# Patient Record
Sex: Female | Born: 1945 | Race: White | Hispanic: No | Marital: Married | Smoking: Never smoker
Health system: Southern US, Community
[De-identification: ages and names within clinical notes are randomized; demographics above are authoritative.]

## PROBLEM LIST (undated history)

## (undated) DIAGNOSIS — I341 Nonrheumatic mitral (valve) prolapse: Secondary | ICD-10-CM

## (undated) DIAGNOSIS — K5790 Diverticulosis of intestine, part unspecified, without perforation or abscess without bleeding: Secondary | ICD-10-CM

## (undated) DIAGNOSIS — C50919 Malignant neoplasm of unspecified site of unspecified female breast: Secondary | ICD-10-CM

## (undated) DIAGNOSIS — J189 Pneumonia, unspecified organism: Secondary | ICD-10-CM

## (undated) DIAGNOSIS — I4891 Unspecified atrial fibrillation: Secondary | ICD-10-CM

## (undated) DIAGNOSIS — K222 Esophageal obstruction: Secondary | ICD-10-CM

## (undated) DIAGNOSIS — I499 Cardiac arrhythmia, unspecified: Secondary | ICD-10-CM

## (undated) DIAGNOSIS — N183 Chronic kidney disease, stage 3 unspecified: Secondary | ICD-10-CM

## (undated) DIAGNOSIS — M1990 Unspecified osteoarthritis, unspecified site: Secondary | ICD-10-CM

## (undated) DIAGNOSIS — I10 Essential (primary) hypertension: Secondary | ICD-10-CM

## (undated) DIAGNOSIS — K219 Gastro-esophageal reflux disease without esophagitis: Secondary | ICD-10-CM

## (undated) DIAGNOSIS — N289 Disorder of kidney and ureter, unspecified: Secondary | ICD-10-CM

## (undated) DIAGNOSIS — R011 Cardiac murmur, unspecified: Secondary | ICD-10-CM

## (undated) DIAGNOSIS — E785 Hyperlipidemia, unspecified: Secondary | ICD-10-CM

## (undated) DIAGNOSIS — I011 Acute rheumatic endocarditis: Secondary | ICD-10-CM

## (undated) DIAGNOSIS — K649 Unspecified hemorrhoids: Secondary | ICD-10-CM

## (undated) DIAGNOSIS — M797 Fibromyalgia: Secondary | ICD-10-CM

## (undated) HISTORY — PX: CATARACT EXTRACTION: SUR2

## (undated) HISTORY — DX: Fibromyalgia: M79.7

## (undated) HISTORY — DX: Malignant neoplasm of unspecified site of unspecified female breast: C50.919

## (undated) HISTORY — PX: CARDIAC CATHETERIZATION: SHX172

## (undated) HISTORY — DX: Unspecified osteoarthritis, unspecified site: M19.90

## (undated) HISTORY — PX: BREAST LUMPECTOMY: SHX2

## (undated) HISTORY — DX: Unspecified hemorrhoids: K64.9

## (undated) HISTORY — DX: Unspecified atrial fibrillation: I48.91

## (undated) HISTORY — DX: Acute rheumatic endocarditis: I01.1

## (undated) HISTORY — PX: DILATION AND CURETTAGE OF UTERUS: SHX78

## (undated) HISTORY — PX: ESOPHAGEAL DILATION: SHX303

## (undated) HISTORY — DX: Diverticulosis of intestine, part unspecified, without perforation or abscess without bleeding: K57.90

## (undated) HISTORY — DX: Chronic kidney disease, stage 3 unspecified: N18.30

## (undated) HISTORY — DX: Essential (primary) hypertension: I10

## (undated) HISTORY — DX: Disorder of kidney and ureter, unspecified: N28.9

## (undated) HISTORY — DX: Hyperlipidemia, unspecified: E78.5

---
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
## 1966-09-21 HISTORY — PX: TONSILLECTOMY: SUR1361

## 1970-09-21 HISTORY — PX: BREAST BIOPSY: SHX20

## 1984-09-21 HISTORY — PX: MASTECTOMY: SHX3

## 1993-09-21 HISTORY — PX: ABDOMINAL HYSTERECTOMY: SHX81

## 1997-09-21 HISTORY — PX: CHOLECYSTECTOMY: SHX55

## 1997-12-20 ENCOUNTER — Encounter: Admission: RE | Admit: 1997-12-20 | Discharge: 1998-03-20 | Payer: Self-pay | Admitting: Internal Medicine

## 1998-01-29 ENCOUNTER — Ambulatory Visit (HOSPITAL_BASED_OUTPATIENT_CLINIC_OR_DEPARTMENT_OTHER): Admission: RE | Admit: 1998-01-29 | Discharge: 1998-01-29 | Payer: Self-pay | Admitting: General Surgery

## 1998-04-18 ENCOUNTER — Ambulatory Visit (HOSPITAL_COMMUNITY): Admission: RE | Admit: 1998-04-18 | Discharge: 1998-04-18 | Payer: Self-pay | Admitting: Family Medicine

## 1998-04-25 ENCOUNTER — Observation Stay (HOSPITAL_COMMUNITY): Admission: RE | Admit: 1998-04-25 | Discharge: 1998-04-26 | Payer: Self-pay | Admitting: General Surgery

## 1998-06-05 ENCOUNTER — Ambulatory Visit (HOSPITAL_COMMUNITY): Admission: RE | Admit: 1998-06-05 | Discharge: 1998-06-05 | Payer: Self-pay | Admitting: General Surgery

## 1998-08-23 ENCOUNTER — Encounter: Payer: Self-pay | Admitting: General Surgery

## 1998-08-23 ENCOUNTER — Ambulatory Visit (HOSPITAL_COMMUNITY): Admission: RE | Admit: 1998-08-23 | Discharge: 1998-08-23 | Payer: Self-pay | Admitting: General Surgery

## 1998-08-30 ENCOUNTER — Ambulatory Visit (HOSPITAL_COMMUNITY): Admission: RE | Admit: 1998-08-30 | Discharge: 1998-08-30 | Payer: Self-pay | Admitting: General Surgery

## 1998-08-30 ENCOUNTER — Encounter: Payer: Self-pay | Admitting: General Surgery

## 1998-09-17 ENCOUNTER — Ambulatory Visit (HOSPITAL_COMMUNITY): Admission: RE | Admit: 1998-09-17 | Discharge: 1998-09-17 | Payer: Self-pay | Admitting: General Surgery

## 1998-09-17 ENCOUNTER — Encounter: Payer: Self-pay | Admitting: General Surgery

## 1998-09-21 HISTORY — PX: THYROID CYST EXCISION: SHX2511

## 1999-01-22 ENCOUNTER — Other Ambulatory Visit: Admission: RE | Admit: 1999-01-22 | Discharge: 1999-01-22 | Payer: Self-pay | Admitting: *Deleted

## 2000-02-06 ENCOUNTER — Encounter: Payer: Self-pay | Admitting: Gastroenterology

## 2000-02-06 ENCOUNTER — Encounter: Admission: RE | Admit: 2000-02-06 | Discharge: 2000-02-06 | Payer: Self-pay | Admitting: Gastroenterology

## 2001-01-17 ENCOUNTER — Encounter: Payer: Self-pay | Admitting: General Surgery

## 2001-01-17 ENCOUNTER — Encounter: Admission: RE | Admit: 2001-01-17 | Discharge: 2001-01-17 | Payer: Self-pay | Admitting: General Surgery

## 2001-01-26 ENCOUNTER — Other Ambulatory Visit: Admission: RE | Admit: 2001-01-26 | Discharge: 2001-01-26 | Payer: Self-pay | Admitting: *Deleted

## 2001-02-17 ENCOUNTER — Inpatient Hospital Stay (HOSPITAL_COMMUNITY): Admission: EM | Admit: 2001-02-17 | Discharge: 2001-02-18 | Payer: Self-pay | Admitting: Emergency Medicine

## 2002-01-31 ENCOUNTER — Encounter (HOSPITAL_BASED_OUTPATIENT_CLINIC_OR_DEPARTMENT_OTHER): Payer: Self-pay | Admitting: Internal Medicine

## 2002-01-31 ENCOUNTER — Encounter: Admission: RE | Admit: 2002-01-31 | Discharge: 2002-01-31 | Payer: Self-pay | Admitting: Internal Medicine

## 2002-09-29 ENCOUNTER — Encounter (HOSPITAL_BASED_OUTPATIENT_CLINIC_OR_DEPARTMENT_OTHER): Payer: Self-pay | Admitting: Internal Medicine

## 2002-09-29 ENCOUNTER — Encounter: Admission: RE | Admit: 2002-09-29 | Discharge: 2002-09-29 | Payer: Self-pay | Admitting: Internal Medicine

## 2002-10-03 ENCOUNTER — Other Ambulatory Visit: Admission: RE | Admit: 2002-10-03 | Discharge: 2002-10-03 | Payer: Self-pay | Admitting: Obstetrics and Gynecology

## 2004-06-27 ENCOUNTER — Encounter: Admission: RE | Admit: 2004-06-27 | Discharge: 2004-06-27 | Payer: Self-pay | Admitting: Internal Medicine

## 2005-07-08 ENCOUNTER — Encounter: Admission: RE | Admit: 2005-07-08 | Discharge: 2005-07-08 | Payer: Self-pay | Admitting: Internal Medicine

## 2005-07-27 ENCOUNTER — Ambulatory Visit (HOSPITAL_COMMUNITY): Admission: RE | Admit: 2005-07-27 | Discharge: 2005-07-27 | Payer: Self-pay | Admitting: *Deleted

## 2005-08-05 ENCOUNTER — Emergency Department (HOSPITAL_COMMUNITY): Admission: EM | Admit: 2005-08-05 | Discharge: 2005-08-05 | Payer: Self-pay | Admitting: Emergency Medicine

## 2005-08-19 ENCOUNTER — Encounter (INDEPENDENT_AMBULATORY_CARE_PROVIDER_SITE_OTHER): Payer: Self-pay | Admitting: Specialist

## 2005-08-19 ENCOUNTER — Ambulatory Visit (HOSPITAL_COMMUNITY): Admission: RE | Admit: 2005-08-19 | Discharge: 2005-08-19 | Payer: Self-pay | Admitting: *Deleted

## 2006-02-04 ENCOUNTER — Encounter: Admission: RE | Admit: 2006-02-04 | Discharge: 2006-02-04 | Payer: Self-pay | Admitting: Internal Medicine

## 2006-09-05 ENCOUNTER — Emergency Department (HOSPITAL_COMMUNITY): Admission: EM | Admit: 2006-09-05 | Discharge: 2006-09-05 | Payer: Self-pay | Admitting: Emergency Medicine

## 2009-10-15 ENCOUNTER — Encounter: Admission: RE | Admit: 2009-10-15 | Discharge: 2009-10-15 | Payer: Self-pay | Admitting: Obstetrics and Gynecology

## 2011-02-06 NOTE — Op Note (Signed)
[name], [name]                 ACCOUNT NO.:  [id number]   MEDICAL RECORD NO.:  [id number]          PATIENT TYPE:  AMB   LOCATION:  ENDO                         FACILITY:  [facility]   PHYSICIAN:  Georgiana Spinner, M.D.    DATE OF BIRTH:  [birth year]   DATE OF PROCEDURE:  08/19/2005  DATE OF DISCHARGE:                                 OPERATIVE REPORT   PROCEDURE:  Colonoscopy with polypectomy.   INDICATIONS:  Colon polyps.   ANESTHESIA:  Demerol 30, Versed 5 mg, Phenergan 12.5 mg.   DESCRIPTION OF PROCEDURE:  With the patient mildly sedated in the left  lateral decubitus position, subsequently rolled to her back with pressure  applied to the abdomen,  the Olympus videoscopic colonoscope was inserted in  the rectum and passed under direct vision to the cecum identified by the  ileocecal valve and appendiceal orifice both of which were photographed.  From this point, the colonoscope was slowly withdrawn taking circumferential  views of the colonic mucosa stopping one fold distal to the hepatic flexure  at which point about approximately a 1 cm polyp was seen, photographed and  removed using snare cautery technique setting of 20/20 blended current. This  was grasped with a Roth retrieval basket and pulled along with the endoscope  as we took circumferential views of the remaining colonic mucosa. We  withdrew the endoscope and retrieved a polyp. We subsequently reinserted the  colonoscope to the rectum which appeared normal on direct and showed  hemorrhoids on retroflexed view. The endoscope was straightened and  withdrawn. The patient's vital signs and pulse oximeter remained stable. The  patient tolerated the procedure well without apparent complications.   FINDINGS:  Diverticulosis of sigmoid colon, internal hemorrhoids. Polyp of  the hepatic flexure area.   PLAN:  Await biopsy report. The patient will call me for results and follow-  up with me as an outpatient.     ______________________________  Georgiana Spinner, M.D.     GMO/MEDQ  D:  08/19/2005  T:  08/19/2005  Job:  [id number]   cc:   Barry Dienes. Eloise Harman, M.D.  Fax: [phone]

## 2011-02-06 NOTE — Discharge Summary (Signed)
[place]  Patient:    Cynthia Farrell, Cynthia Farrell                        MRN: [id number] Adm. Date:  [id number] Disc. Date: [id number] Attending:  Clovis Cao Dictator:   Darcella Gasman. Annie Paras, F.N.P.C. CC:         Runell Gess, M.D.  Jaclyn Prime. Lucas Mallow, M.D.  Dr. Joseph Art   Discharge Summary  DISCHARGE DIAGNOSES: 1. Chest pain, nonspecific. 2. Abnormal stress test. 3. Nonsustained ventricular tachycardia, exercise induced. 4. Hypertension. 5. History of breast cancer.  DISCHARGE CONDITION:  Improved.  PROCEDURE:  Feb 18, 2001, combined left heart cath by Runell Gess, M.D.  DISCHARGE MEDICATIONS: 1. Zocor 40 mg daily at bedtime. 2. Metoprolol 50 mg one half tablet twice a day. 3. Calan 120 mg daily. 4. Lanoxin 0.25 one half tablet daily. 5. Enteric coated aspirin one daily.  DISCHARGE INSTRUCTIONS: 1. No strenuous activity, no sexual activity, no lifting over 10 pounds for    three days.  No work until Monday. 2. Low fat, low salt diet. 3. Wash cath site with soap and water. Call if any bleeding, swelling, or    drainage. 4. Follow-up with Jaclyn Prime. Lucas Mallow, M.D., in two weeks.  HISTORY OF PRESENT ILLNESS:  Dr. Allyson Sabal was asked by Moshe Salisbury, N.P., and Dr. Lucas Mallow to Cynthia Farrell on arrival to the emergency room at [facility] on Feb 17, 2001, after she had symptomatic exercise treadmill.  She had, over the last few months, complained of tightness in her throat and neck and some dizziness. Treadmill was ordered and was being done when she developed some ventricular tachycardia and ST depression in the inferior lateral leads. She was also dizzy and short of breath. She was given nitroglycerin.  Her shortness of breath improved and the patient was transported to [facility] [employer] by EMS.  In the emergency room she had no pain.  She did also state she had been having palpitations.  PAST MEDICAL  HISTORY:  Positive for hypertension, breast cancer, elevated cholesterol.  PAST SURGICAL HISTORY:  Right mastectomy in 1986, lumpectomy and two biopsies on the right, tonsillectomy, hysterectomy, thyroid nodule removal, laparoscopic cholecystectomy, liver biopsy for elevated LFTs.  FAMILY HISTORY:  See H&P.  SOCIAL HISTORY:  See H&P.  ALLERGIES:  No known drug allergies.  OUTPATIENT MEDICATIONS: 1. Calan 240 daily. 2. Lanoxin daily. 3. Zocor. 4. Aspirin. 5. Ambien p.r.n.  PHYSICAL EXAMINATION AT DISCHARGE:  GENERAL APPEARANCE:  An alert and oriented white female in no acute distress.  SKIN:  Warm and dry.  VITAL SIGNS:  Blood pressure 100/48, pulse 50, respiratory rate 18, temperature 97.2, room air oxygen saturation 98%.  LUNGS:  Clear without wheezing, rhonchi or rales.  CARDIOVASCULAR:  S1, S2, regular rate and rhythm.  EXTREMITIES:  Right groin stable. No hematoma and 2+ pedals.  LABORATORY DATA:  Hemoglobin on admission 14.3, hematocrit 41.5, wbc 9.6, platelets 279; neutrophils 70, lymphs 21, monos 8, eos 1, baso 1.  Protime 13.1, INR 1.0, PTT 37; on heparin she was therapeutic.  Hemoglobin did not change during hospitalization, neither did platelets.  Chemistries:  Sodium 138, potassium 3.8, chloride 107, CO2 26, glucose 89, BUN 10, creatinine 0.6, calcium 10, total protein 6.5, albumin 3.9, AST 23, ALT 27, ALP 139, total bilirubin 0.8, magnesium 2.0.  Cardiac enzymes:  CKs 66 and 70, MB 1.7 and 1.5,  troponin I 0.03 and 0.04. Lipid panel:  Cholesterol 156, triglycerides 146, HDL 36, and LDL 91.  Chest x-ray was done on arrival. The results are not in the chart.  EKG on admission showed sinus rhythm, rate of 60, incomplete right bundle branch block, nonspecific T-wave abnormality.  Follow-up 10 minutes later, heart rate was 49.  HOSPITAL COURSE:  Cynthia Farrell was admitted to [facility] Feb 17, 2001, after abnormal stress test and  nonsustained ventricular tachycardia.  She was admitted and placed on heparin, nitroglycerin, and cath was planned for Feb 18, 2001.  The patient underwent cardiac catheterization.  Her coronary arteries were normal.  Her LV function was normal.  Empiric treatment for nonsustained ventricular tachycardia would begin with a low dose beta-blocker and Dr. Allyson Sabal felt she was stable to go home. Dr. Lucas Mallow also saw her and discharged her home. DD:  03/16/01 TD:  03/16/01 Job: [id number]

## 2011-02-06 NOTE — Cardiovascular Report (Signed)
. [facility]  Patient:    Cynthia Farrell, Cynthia Farrell                        MRN: [id number] Proc. Date: 02/18/01 Adm. Date:  [id number] Attending:  Berry, Jonathan [place] CC:         Cardiac Catheterization Laboratory  Jaclyn Prime. Lucas Mallow, M.D.   Cardiac Catheterization  PROCEDURES PERFORMED:  Cardiac catheterization.  INDICATIONS:  The patient is a 65 year old white female, with a history of hypertension and hyperlipidemia, who has had atypical chest pain and a GXT rem for exercise-induced nonsustained ventricular tachycardia.  She was admitted and presents now for diagnostic coronary arteriography.  DESCRIPTION OF PROCEDURE:  The patient was brought to the second floor cardiac catheterization lab in the postabsorptive state.  She was premedicated with p.o. Valium.  Her right groin was prepped and shaved in the usual sterile fashion.  Xylocaine 1% was used for local anesthesia.  A 6 French sheath was inserted into the right femoral artery using standard Seldinger technique.  A 6 French right and left Judkins diagnostic catheter, as well as a 6 French pigtail catheter were used for selective coronary angiography, left ventriculography, respectively.  Omnipaque dye was used for the entirety of the case.  Retrograde, aortic, left ventricular and pullback pressures were recorded.  HEMODYNAMICS: 1. Aortic systolic pressure 146, diastolic pressure 68. 2. Left ventricular systolic pressure 146 and diastolic pressure 16.  SELECTIVE CORONARY ANGIOGRAPHY: 1. Left main:  Normal. 2. Left anterior descending:  The LAD normal. 3. Left circumflex:  Normal. 4. Right coronary artery:  Dominant and normal.  LEFT VENTRICULOGRAPHY:  The RAO left ventriculogram was performed using 25 cc of Omnipaque dye at 12 cc/sec.  Thee overall LVEF was estimated at 70% with ______ obliteration.  There were no focal wall motion abnormalities noted.  IMPRESSION:  The patient has atypical  chest pain, exercise-induced nonsustained ventricular tachycardia and essentially normal coronary arteries and normal left ventricular function.  I believe her symptoms are noncardiac and most likely gastrointestinal.  She will be treated empirically with antireflux measures and low-dose beta blockade.  She will be discharged home later today and will follow up with Dr. Aggie Cosier.  The sheaths were removed and pressure was held on the groin to achieve hemostasis.  The patient left the lab in stable condition. DD:  02/18/01 TD:  02/18/01 Job: [id number]

## 2011-02-06 NOTE — Op Note (Signed)
Cynthia Farrell, Cynthia Farrell                 ACCOUNT NO.:  [id number]   MEDICAL RECORD NO.:  [id number]          PATIENT TYPE:  AMB   LOCATION:  ENDO                         FACILITY:  [facility]   PHYSICIAN:  Georgiana Spinner, M.D.    DATE OF BIRTH:  [birth year]   DATE OF PROCEDURE:  08/19/2005  DATE OF DISCHARGE:                                 OPERATIVE REPORT   PROCEDURE:  Upper endoscopy.   INDICATIONS:  Gastroesophageal reflux disease.   ANESTHESIA:  Demerol 70, Versed 7 mg, Phenergan 12.5 mg were all given.   DESCRIPTION OF PROCEDURE:  With the patient mildly sedated in the left  lateral decubitus position, the Olympus videoscopic endoscope was inserted  in the mouth and passed under direct vision through the esophagus which  appeared normal until we reached the distal esophagus and it appeared to  have an early stricture formation. We photographed this and biopsied it. We  entered into the stomach through a hiatal hernia. The fundus, body, antrum,  duodenal bulb, and second portion of duodenum were visualized. From this  point, the endoscope was slowly withdrawn taking circumferential views of  the duodenal mucosa until the endoscope had been pulled back into the  stomach, placed in retroflexion to view the stomach from below. The  endoscope was then straightened and withdrawn taking circumferential views  of the remaining gastric and esophageal mucosa. The patient's vital signs  and pulse oximeter remained stable. The patient tolerated the procedure well  without apparent complications.   FINDINGS:  Early stricture of distal esophagus biopsied, await biopsy  report. The patient will call me for results and follow-up with me as an  outpatient. Proceed to colonoscopy as planned.           ______________________________  Georgiana Spinner, M.D.     GMO/MEDQ  D:  08/19/2005  T:  08/19/2005  Job:  [id number]   cc:   Barry Dienes. Eloise Harman, M.D.  Fax: [phone]

## 2012-02-08 ENCOUNTER — Encounter: Payer: Self-pay | Admitting: Internal Medicine

## 2012-02-10 ENCOUNTER — Telehealth: Payer: Self-pay | Admitting: Internal Medicine

## 2012-02-10 NOTE — Telephone Encounter (Signed)
Left message for pt to call back  °

## 2012-02-10 NOTE — Telephone Encounter (Signed)
Pt scheduled to see Mike Gip PA tomorrow at 3:30pm. Pt aware of appt date and time.

## 2012-02-11 ENCOUNTER — Encounter: Payer: Self-pay | Admitting: Physician Assistant

## 2012-02-11 ENCOUNTER — Ambulatory Visit (INDEPENDENT_AMBULATORY_CARE_PROVIDER_SITE_OTHER): Payer: Medicare Other | Admitting: Physician Assistant

## 2012-02-11 ENCOUNTER — Other Ambulatory Visit (INDEPENDENT_AMBULATORY_CARE_PROVIDER_SITE_OTHER): Payer: Medicare Other

## 2012-02-11 VITALS — BP 114/60 | HR 60 | Ht 62.0 in | Wt 204.2 lb

## 2012-02-11 DIAGNOSIS — M797 Fibromyalgia: Secondary | ICD-10-CM | POA: Insufficient documentation

## 2012-02-11 DIAGNOSIS — K222 Esophageal obstruction: Secondary | ICD-10-CM | POA: Insufficient documentation

## 2012-02-11 DIAGNOSIS — R1013 Epigastric pain: Secondary | ICD-10-CM

## 2012-02-11 DIAGNOSIS — K921 Melena: Secondary | ICD-10-CM

## 2012-02-11 DIAGNOSIS — R1032 Left lower quadrant pain: Secondary | ICD-10-CM

## 2012-02-11 DIAGNOSIS — I359 Nonrheumatic aortic valve disorder, unspecified: Secondary | ICD-10-CM

## 2012-02-11 DIAGNOSIS — IMO0001 Reserved for inherently not codable concepts without codable children: Secondary | ICD-10-CM

## 2012-02-11 DIAGNOSIS — Z86010 Personal history of colonic polyps: Secondary | ICD-10-CM

## 2012-02-11 DIAGNOSIS — K219 Gastro-esophageal reflux disease without esophagitis: Secondary | ICD-10-CM | POA: Insufficient documentation

## 2012-02-11 DIAGNOSIS — Z853 Personal history of malignant neoplasm of breast: Secondary | ICD-10-CM | POA: Insufficient documentation

## 2012-02-11 DIAGNOSIS — I352 Nonrheumatic aortic (valve) stenosis with insufficiency: Secondary | ICD-10-CM

## 2012-02-11 LAB — CBC WITH DIFFERENTIAL/PLATELET
Basophils Absolute: 0 10*3/uL (ref 0.0–0.1)
Basophils Relative: 0.4 % (ref 0.0–3.0)
Eosinophils Absolute: 0.2 10*3/uL (ref 0.0–0.7)
Eosinophils Relative: 2 % (ref 0.0–5.0)
HCT: 28.7 % — ABNORMAL LOW (ref 36.0–46.0)
Hemoglobin: 9.5 g/dL — ABNORMAL LOW (ref 12.0–15.0)
Lymphocytes Relative: 34.5 % (ref 12.0–46.0)
Lymphs Abs: 3.2 10*3/uL (ref 0.7–4.0)
MCHC: 33 g/dL (ref 30.0–36.0)
MCV: 91 fl (ref 78.0–100.0)
Monocytes Absolute: 0.6 10*3/uL (ref 0.1–1.0)
Monocytes Relative: 6.7 % (ref 3.0–12.0)
Neutro Abs: 5.3 10*3/uL (ref 1.4–7.7)
Neutrophils Relative %: 56.4 % (ref 43.0–77.0)
Platelets: 354 10*3/uL (ref 150.0–400.0)
RBC: 3.16 Mil/uL — ABNORMAL LOW (ref 3.87–5.11)
RDW: 14.9 % — ABNORMAL HIGH (ref 11.5–14.6)
WBC: 9.4 10*3/uL (ref 4.5–10.5)

## 2012-02-11 MED ORDER — MOVIPREP 100 G PO SOLR: ORAL | Status: DC

## 2012-02-11 NOTE — Patient Instructions (Addendum)
Please go to the basement level to have your labs drawn. Stay off Aspirin and Naproxen. Colonoscopy A colonoscopy is an exam to evaluate your entire colon. In this exam, your colon is cleansed. A long fiberoptic tube is inserted through your rectum and into your colon. The fiberoptic scope (endoscope) is a long bundle of enclosed and very flexible fibers. These fibers transmit light to the area examined and send images from that area to your caregiver. Discomfort is usually minimal. You may be given a drug to help you sleep (sedative) during or prior to the procedure. This exam helps to detect lumps (tumors), polyps, inflammation, and areas of bleeding. Your caregiver may also take a small piece of tissue (biopsy) that will be examined under a microscope. LET YOUR CAREGIVER KNOW ABOUT:   Allergies to food or medicine.   Medicines taken, including vitamins, herbs, eyedrops, over-the-counter medicines, and creams.   Use of steroids (by mouth or creams).   Previous problems with anesthetics or numbing medicines.   History of bleeding problems or blood clots.   Previous surgery.   Other health problems, including diabetes and kidney problems.   Possibility of pregnancy, if this applies.  BEFORE THE PROCEDURE   A clear liquid diet may be required for 2 days before the exam.   Ask your caregiver about changing or stopping your regular medications.   Liquid injections (enemas) or laxatives may be required.   A large amount of electrolyte solution may be given to you to drink over a short period of time. This solution is used to clean out your colon.   You should be present 60 minutes prior to your procedure or as directed by your caregiver.  AFTER THE PROCEDURE   If you received a sedative or pain relieving medication, you will need to arrange for someone to drive you home.   Occasionally, there is a little blood passed with the first bowel movement. Do not be concerned.  FINDING OUT  THE RESULTS OF YOUR TEST Not all test results are available during your visit. If your test results are not back during the visit, make an appointment with your caregiver to find out the results. Do not assume everything is normal if you have not heard from your caregiver or the medical facility. It is important for you to follow up on all of your test results. HOME CARE INSTRUCTIONS   It is not unusual to pass moderate amounts of gas and experience mild abdominal cramping following the procedure. This is due to air being used to inflate your colon during the exam. Walking or a warm pack on your belly (abdomen) may help.   You may resume all normal meals and activities after sedatives and medicines have worn off.   Only take over-the-counter or prescription medicines for pain, discomfort, or fever as directed by your caregiver. Do not use aspirin or blood thinners if a biopsy was taken. Consult your caregiver for medicine usage if biopsies were taken.  SEEK IMMEDIATE MEDICAL CARE IF:   You have a fever.   You pass large blood clots or fill a toilet with blood following the procedure. This may also occur 10 to 14 days following the procedure. This is more likely if a biopsy was taken.   You develop abdominal pain that keeps getting worse and cannot be relieved with medicine.  Document Released: 09/04/2000 Document Revised: 08/27/2011 Document Reviewed: 04/19/2008   Upper GI Endoscopy Upper GI endoscopy means using a flexible scope to  look at the esophagus, stomach, and upper small bowel. This is done to make a diagnosis in people with heartburn, abdominal pain, or abnormal bleeding. Sometimes an endoscope is needed to remove foreign bodies or food that become stuck in the esophagus; it can also be used to take biopsy samples. For the best results, do not eat or drink for 8 hours before having your upper endoscopy.  To perform the endoscopy, you will probably be sedated and your throat will be  numbed with a special spray. The endoscope is then slowly passed down your throat (this will not interfere with your breathing). An endoscopy exam takes 15 to 30 minutes to complete and there is no real pain. Patients rarely remember much about the procedure. The results of the test may take several days if a biopsy or other test is taken.  You may have a sore throat after an endoscopy exam. Serious complications are very rare. Stick to liquids and soft foods until your pain is better. Do not drive a car or operate any dangerous equipment for at least 24 hours after being sedated. SEEK IMMEDIATE MEDICAL CARE IF:   You have severe throat pain.   You have shortness of breath.   You have bleeding problems.   You have a fever.   You have difficulty recovering from your sedation.  Document Released: 10/15/2004 Document Revised: 08/27/2011 Document Reviewed: 09/09/2008 [facility] Patient Information 2012 [place], [place].  We scheduled the Endoscopy/Colonoscopy with Dr. Melvia Heaps on 02-12-2012, Friday at [facility]. We have given you samples of Zegerid, Take 1 tab twice daily.

## 2012-02-11 NOTE — Progress Notes (Addendum)
Subjective:    Patient ID: Cynthia Farrell, female    DOB: 13-Feb-1946, 66 y.o.   MRN: [id number]  HPI Cynthia Farrell is a pleasant 66 year old white female, new to our practice today. Her primary M.D. is Dr. Pearson Grippe. She had previously been seen by Dr. Virginia Rochester, and relates having colonoscopy and upper endoscopy in 2006. I do not have copies of those reports at this time but she states that she did have adenomatous colon polyps and was told to followup in 3 years which she did not do 2 loss of insurance. She also has history of chronic GERD and had an esophageal stricture which he dilated at that time.  She comes in today with complaints of dark stools over the past 3 weeks. She has also had associated fatigue and intermittent lightheadedness. She has history of fibromyalgia, and had been taking a regular aspirin once daily as well as naproxen 3 times daily at 1 point which seemed to cause bruising so she decreased this to once daily. Since seeing Dr. Selena Batten earlier in the week she's off of the aspirin and naproxen. She also had taken a course of prednisone in April . She had not been on any regular PPI but had been using Zegerid when necessary. She says she has had a lot of heartburn and indigestion recently as well as a feeling of swelling and bloating immediately postprandially. She admits to some early satiety symptoms. Also has had a couple of episodes of solid food dysphasia. She says she initially noted dark stools about 3 weeks ago and says that every bowel movement since has been dark not had any obvious blood and usually is a WBC of 12.5 hemoglobin 9.5 hematocrit of 27.6 platelets 260 MCV of 90.5 BUN was 26 she did 27 creatinine 0.79 liver function studies were normal ERP was elevated at 7 having one bowel movement per day. Her husband states that she's been extremely fatigued and unable to do much of anything over the past week. She really denies anything in the way of abdominal pain.  Labs done on 02/09/2012 showed  hemoglobin of 9.5 hematocrit of 27.6 platelets 260 MCV of 90.5 WBC of 12.5 BUN was 27 creatinine 0.79 liver function studies normal CRP elevated at 7  Review of Systems  Constitutional: Positive for activity change, appetite change and fatigue.  HENT: Positive for trouble swallowing.   Eyes: Negative.   Respiratory: Negative.   Cardiovascular: Negative.   Gastrointestinal: Positive for abdominal pain and blood in stool.  Genitourinary: Negative.   Musculoskeletal: Positive for back pain and arthralgias.  Skin: Negative.   Neurological: Positive for weakness.  Hematological: Negative.   Psychiatric/Behavioral: Negative.       Outpatient Encounter Prescriptions as of 02/11/2012  Medication Sig Dispense Refill  . diltiazem (DILACOR XR) 180 MG 24 hr capsule Take 180 mg by mouth daily.      Marland Kitchen HYDROcodone-acetaminophen (VICODIN ES) 7.5-750 MG per tablet Take 1 tablet by mouth every 6 (six) hours as needed.      Marland Kitchen omeprazole-sodium bicarbonate (ZEGERID) 40-1100 MG per capsule Take 1 capsule by mouth daily before breakfast.      . pravastatin (PRAVACHOL) 40 MG tablet Take 40 mg by mouth daily.      . traMADol (ULTRAM) 50 MG tablet Take 50 mg by mouth every 6 (six) hours as needed.      . valsartan (DIOVAN) 40 MG tablet Take 40 mg by mouth daily.      Marland Kitchen zolpidem (AMBIEN)  10 MG tablet Take 10 mg by mouth at bedtime as needed.       No Known Allergies Patient Active Problem List  Diagnoses  . Fibromyalgia  . Aortic insufficiency and aortic stenosis  . Hx of breast cancer  . Hx of adenomatous colonic polyps  . GERD with stricture   History   Social History  . Marital Status: Married    Spouse Name: N/A    Number of Children: 2  . Years of Education: N/A   Occupational History  . Sales     [employer]   Social History Main Topics  . Smoking status: Never Smoker   . Smokeless tobacco: Never Used  . Alcohol Use: No  . Drug Use: No  . Sexually Active: Not on file   Other Topics  Concern  . Not on file   Social History Narrative  . No narrative on file   Past Surgical History  Procedure Date  . Appendectomy   . Cholecystectomy   . Abdominal hysterectomy   . Mastectomy   . Breast biopsy   . Thyroid cyst excision     Objective:   Physical Exam well-developed older white female in no acute distress, accompanied by her husband. Blood pressure 114/60 pulse 60 height 5 foot 2 weight 204. HEENT; nontraumatic normocephalic EOMI PERRLA sclera, anicteric conjunctiva are somewhat pale, Supple no JVD, Cardiovascular; regular rate and rhythm with S1-S2 she is a 3/6 systolic murmur, Pulmonary; clear bilaterally, Abdomen, large, soft she is tender in the left lower quadrant and also mildly in the epigastrium there is no palpable mass or hepatosplenomegaly BS sounds are present, Rectal; exam dark brown stool which is Hemoccult-positive no lesion noted on Extremities; no clubbing, cyanosis or edema skin warm and dry, Psych; mood and affect normal and appropriate.        Assessment & Plan:  #65 66 year old female with history of dark stools x3 weeks, and new finding of a normocytic anemia with hemoglobin of 9.5  days ago. Patient has associated significant fatigue. Patient is at increased risk for peptic ulcer disease given recent aspirin and NSAID and steroid use. #2 history of adenomatous colon polyps, last colonoscopy 2006 and was requested to have followup in 3 years. Also need to rule out occult colon lesion  #3 Fibromyalgia #4 history of breast cancer #5 aortic stenosis   Plan; increase Zegerid to one twice daily Repeat CBC today, patient is symptomatic and if her hemoglobin is 8 or less she will need transfusion. Agree patient needs to stay off all aspirin and NSAIDs at this point. Have scheduled for upper endoscopy and colonoscopy with Dr. Arlyce Dice at [facility] tomorrow to expedite her workup. Procedures were discussed in detail with the patient and her husband  including the possibility of admission.  Addendum Reviewed and agree with management. Beverley Fiedler, MD

## 2012-02-12 ENCOUNTER — Ambulatory Visit (HOSPITAL_COMMUNITY)
Admission: RE | Admit: 2012-02-12 | Discharge: 2012-02-12 | Disposition: A | Discharge status: Home / Self Care | Payer: Medicare Other | Source: Ambulatory Visit | Attending: Gastroenterology | Admitting: Gastroenterology

## 2012-02-12 ENCOUNTER — Encounter (HOSPITAL_COMMUNITY)
Admission: RE | Disposition: A | Discharge status: Home / Self Care | Payer: Self-pay | Source: Ambulatory Visit | Attending: Gastroenterology

## 2012-02-12 ENCOUNTER — Encounter (HOSPITAL_COMMUNITY): Payer: Self-pay | Admitting: *Deleted

## 2012-02-12 ENCOUNTER — Encounter: Payer: Self-pay | Admitting: Gastroenterology

## 2012-02-12 DIAGNOSIS — Z860100 Personal history of colon polyps, unspecified: Secondary | ICD-10-CM | POA: Insufficient documentation

## 2012-02-12 DIAGNOSIS — K253 Acute gastric ulcer without hemorrhage or perforation: Secondary | ICD-10-CM | POA: Diagnosis present

## 2012-02-12 DIAGNOSIS — Z79899 Other long term (current) drug therapy: Secondary | ICD-10-CM | POA: Insufficient documentation

## 2012-02-12 DIAGNOSIS — K921 Melena: Secondary | ICD-10-CM | POA: Diagnosis present

## 2012-02-12 DIAGNOSIS — K648 Other hemorrhoids: Secondary | ICD-10-CM | POA: Insufficient documentation

## 2012-02-12 DIAGNOSIS — K31819 Angiodysplasia of stomach and duodenum without bleeding: Secondary | ICD-10-CM

## 2012-02-12 DIAGNOSIS — K259 Gastric ulcer, unspecified as acute or chronic, without hemorrhage or perforation: Secondary | ICD-10-CM | POA: Insufficient documentation

## 2012-02-12 DIAGNOSIS — Q2733 Arteriovenous malformation of digestive system vessel: Secondary | ICD-10-CM

## 2012-02-12 DIAGNOSIS — Z853 Personal history of malignant neoplasm of breast: Secondary | ICD-10-CM | POA: Insufficient documentation

## 2012-02-12 DIAGNOSIS — K5730 Diverticulosis of large intestine without perforation or abscess without bleeding: Secondary | ICD-10-CM | POA: Insufficient documentation

## 2012-02-12 DIAGNOSIS — D62 Acute posthemorrhagic anemia: Secondary | ICD-10-CM | POA: Diagnosis present

## 2012-02-12 DIAGNOSIS — K222 Esophageal obstruction: Secondary | ICD-10-CM

## 2012-02-12 HISTORY — PX: ESOPHAGOGASTRODUODENOSCOPY: SHX5428

## 2012-02-12 HISTORY — DX: Gastro-esophageal reflux disease without esophagitis: K21.9

## 2012-02-12 HISTORY — PX: HOT HEMOSTASIS: SHX5433

## 2012-02-12 HISTORY — PX: COLONOSCOPY: SHX5424

## 2012-02-12 HISTORY — DX: Esophageal obstruction: K22.2

## 2012-02-12 SURGERY — COLONOSCOPY
Anesthesia: Moderate Sedation

## 2012-02-12 MED ORDER — MIDAZOLAM HCL 10 MG/2ML IJ SOLN
INTRAMUSCULAR | Status: AC
  Filled 2012-02-12: qty 4

## 2012-02-12 MED ORDER — DIPHENHYDRAMINE HCL 50 MG/ML IJ SOLN
INTRAMUSCULAR | Status: DC | PRN
  Administered 2012-02-12: 25 mg via INTRAVENOUS
  Administered 2012-02-12 (×2): 12.5 mg via INTRAVENOUS

## 2012-02-12 MED ORDER — GLYCOPYRROLATE 0.2 MG/ML IJ SOLN
INTRAMUSCULAR | Status: AC
  Filled 2012-02-12: qty 1

## 2012-02-12 MED ORDER — BUTAMBEN-TETRACAINE-BENZOCAINE 2-2-14 % EX AERO
INHALATION_SPRAY | CUTANEOUS | Status: DC | PRN
  Administered 2012-02-12: 2 via TOPICAL

## 2012-02-12 MED ORDER — FENTANYL CITRATE 0.05 MG/ML IJ SOLN
INTRAMUSCULAR | Status: AC
  Filled 2012-02-12: qty 4

## 2012-02-12 MED ORDER — MIDAZOLAM HCL 10 MG/2ML IJ SOLN
INTRAMUSCULAR | Status: DC | PRN
  Administered 2012-02-12 (×2): 2 mg via INTRAVENOUS
  Administered 2012-02-12: 1 mg via INTRAVENOUS
  Administered 2012-02-12 (×3): 2 mg via INTRAVENOUS

## 2012-02-12 MED ORDER — SODIUM CHLORIDE 0.9 % IV SOLN
INTRAVENOUS | Status: DC
  Administered 2012-02-12: 500 mL via INTRAVENOUS

## 2012-02-12 MED ORDER — DIPHENHYDRAMINE HCL 50 MG/ML IJ SOLN
INTRAMUSCULAR | Status: AC
  Filled 2012-02-12: qty 1

## 2012-02-12 MED ORDER — FENTANYL NICU IV SYRINGE 50 MCG/ML
INJECTION | INTRAMUSCULAR | Status: DC | PRN
  Administered 2012-02-12 (×7): 25 ug via INTRAVENOUS

## 2012-02-12 MED ORDER — GLYCOPYRROLATE 0.2 MG/ML IJ SOLN
INTRAMUSCULAR | Status: DC | PRN
  Administered 2012-02-12: 0.2 mg via INTRAVENOUS

## 2012-02-12 NOTE — Discharge Instructions (Addendum)
Take zegerid twice a day for 2 weeks, then daily. Avoid NSAIDS  Colonoscopy Care After Read the instructions outlined below and refer to this sheet in the next few weeks. These discharge instructions provide you with general information on caring for yourself after you leave the hospital. Your doctor may also give you specific instructions. While your treatment has been planned according to the most current medical practices available, unavoidable complications occasionally occur. If you have any problems or questions after discharge, call your doctor. HOME CARE INSTRUCTIONS ACTIVITY:  You may resume your regular activity, but move at a slower pace for the next 24 hours.   Take frequent rest periods for the next 24 hours.   Walking will help get rid of the air and reduce the bloated feeling in your belly (abdomen).   No driving for 24 hours (because of the medicine (anesthesia) used during the test).   You may shower.   Do not sign any important legal documents or operate any machinery for 24 hours (because of the anesthesia used during the test).  NUTRITION:  Drink plenty of fluids.   You may resume your normal diet as instructed by your doctor.   Begin with a light meal and progress to your normal diet. Heavy or fried foods are harder to digest and may make you feel sick to your stomach (nauseated).   Avoid alcoholic beverages for 24 hours or as instructed.  MEDICATIONS:  You may resume your normal medications unless your doctor tells you otherwise.  WHAT TO EXPECT TODAY:  Some feelings of bloating in the abdomen.   Passage of more gas than usual.   Spotting of blood in your stool or on the toilet paper.  IF YOU HAD POLYPS REMOVED DURING THE COLONOSCOPY:  No aspirin products for 7 days or as instructed.   No alcohol for 7 days or as instructed.   Eat a soft diet for the next 24 hours.  FINDING OUT THE RESULTS OF YOUR TEST Not all test results are available during your  visit. If your test results are not back during the visit, make an appointment with your caregiver to find out the results. Do not assume everything is normal if you have not heard from your caregiver or the medical facility. It is important for you to follow up on all of your test results.  SEEK IMMEDIATE MEDICAL CARE IF:  You have more than a spotting of blood in your stool.   Your belly is swollen (abdominal distention).   You are nauseated or vomiting.   You have a fever.   You have abdominal pain or discomfort that is severe or gets worse throughout the day.  Document Released: 04/21/2004 Document Revised: 08/27/2011 Document Reviewed: 04/19/2008 [facility] Patient Information 2012 [place], [place].  Endoscopy Care After Please read the instructions outlined below and refer to this sheet in the next few weeks. These discharge instructions provide you with general information on caring for yourself after you leave the hospital. Your doctor may also give you specific instructions. While your treatment has been planned according to the most current medical practices available, unavoidable complications occasionally occur. If you have any problems or questions after discharge, please call your doctor. HOME CARE INSTRUCTIONS Activity  You may resume your regular activity but move at a slower pace for the next 24 hours.   Take frequent rest periods for the next 24 hours.   Walking will help expel (get rid of) the air and reduce the  bloated feeling in your abdomen.   No driving for 24 hours (because of the anesthesia (medicine) used during the test).   You may shower.   Do not sign any important legal documents or operate any machinery for 24 hours (because of the anesthesia used during the test).  Nutrition  Drink plenty of fluids.   You may resume your normal diet.   Begin with a light meal and progress to your normal diet.   Avoid alcoholic beverages for 24 hours or as  instructed by your caregiver.  Medications You may resume your normal medications unless your caregiver tells you otherwise. What you can expect today  You may experience abdominal discomfort such as a feeling of fullness or "gas" pains.   You may experience a sore throat for 2 to 3 days. This is normal. Gargling with salt water may help this.  Follow-up Your doctor will discuss the results of your test with you. SEEK IMMEDIATE MEDICAL CARE IF:  You have excessive nausea (feeling sick to your stomach) and/or vomiting.   You have severe abdominal pain and distention (swelling).   You have trouble swallowing.   You have a temperature over 100 F (37.8 C).   You have rectal bleeding or vomiting of blood.  Document Released: 04/21/2004 Document Revised: 08/27/2011 Document Reviewed: 11/02/2007 [facility] Patient Information 2012 [place], [place].

## 2012-02-12 NOTE — Op Note (Signed)
[facility] [address] [place], [place]  [address]  ENDOSCOPY PROCEDURE REPORT  PATIENT:  Cynthia Farrell, Cynthia Farrell  MR#:  [id number] BIRTHDATE:  [birth year], 65 yrs. old  GENDER:  female  ENDOSCOPIST:  Barbette Hair. Arlyce Dice, MD Referred by:  Pearson Grippe, M.D.  PROCEDURE DATE:  02/12/2012 PROCEDURE:  EGD with ablation of tumor, EGD with biopsy, [address]39, Maloney Dilation of Esophagus ASA CLASS:  Class II INDICATIONS:  MEDICATIONS:   There was residual sedation effect present from prior procedure., Fentanyl 25 mcg IV TOPICAL ANESTHETIC:  Cetacaine Spray  DESCRIPTION OF PROCEDURE:   After the risks and benefits of the procedure were explained, informed consent was obtained.  The Pentax Gastroscope [id number] endoscope was introduced through the mouth and advanced to the third portion of the duodenum.  The instrument was slowly withdrawn as the mucosa was fully examined. <<PROCEDUREIMAGES>>  An ulcer was found pyloric channel It was 6 mm in size. Clean-based ulcer. Bx taken (see image7).  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 Early esophageal stricture Dilation with maloney dilator 18mm Moderate resistance; no heme  Otherwise the examination was normal. Retroflexed views revealed no abnormalities.    The scope was then withdrawn from the patient and the procedure completed.  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 Arlyce Dice, MD  CC:  n. eSIGNED:   Barbette Hair. Arienne Gartin at 02/12/2012 09:15 AM  Abigail Butts, [phone]

## 2012-02-12 NOTE — Interval H&P Note (Signed)
History and Physical Interval Note:  02/12/2012 8:13 AM  Beila L Oommen  has presented today for surgery, with the diagnosis of Melena LLQ pain Epigastric pain Anemia  The various methods of treatment have been discussed with the patient and family. After consideration of risks, benefits and other options for treatment, the patient has consented to  Procedure(s) (LRB): COLONOSCOPY (N/A) ESOPHAGOGASTRODUODENOSCOPY (EGD) (N/A) as a surgical intervention .  The patients' history has been reviewed, patient examined, no change in status, stable for surgery.  I have reviewed the patients' chart and labs.  Questions were answered to the patient's satisfaction.     The recent H&P (dated *02/11/12**) was reviewed, the patient was examined and there is no change in the patients condition since that H&P was completed.   Melvia Heaps  02/12/2012, 8:13 AM   Melvia Heaps

## 2012-02-12 NOTE — Op Note (Signed)
[facility] [address] [place], [place]  [address]  COLONOSCOPY PROCEDURE REPORT  PATIENT:  Cynthia, Farrell  MR#:  [id number] BIRTHDATE:  07-Oct-1945, 65 yrs. old  GENDER:  female ENDOSCOPIST:  Barbette Hair. Arlyce Dice, MD REF. BY:  Pearson Grippe, M.D. PROCEDURE DATE:  02/12/2012 PROCEDURE:  Diagnostic Colonoscopy ASA CLASS:  Class II INDICATIONS:  Screening, history of pre-cancerous (adenomatous) colon polyps index polypectomy 2006 MEDICATIONS:   These medications were titrated to patient response per physician's verbal order, Fentanyl 150 mg IV, Versed 11 mg IV, Benadryl 50 mg IV, glycopyrrolate (Robinal) 0.2 mg IV  DESCRIPTION OF PROCEDURE:   After the risks benefits and alternatives of the procedure were thoroughly explained, informed consent was obtained.  Digital rectal exam was performed and revealed no abnormalities.   The Pentax Colonoscope [id number] endoscope was introduced through the anus and advanced to the cecum, which was identified by both the appendix and ileocecal valve, without limitations.  The quality of the prep was excellent, using MoviPrep.  The instrument was then slowly withdrawn as the colon was fully examined. <<PROCEDUREIMAGES>>  FINDINGS:  Scattered diverticula were found transverse to sigmoid Internal Hemorrhoids were found (see image5).  This was otherwise a normal examination of the colon (see image3).   Retroflexed views in the rectum revealed no abnormalities.    The time to cecum = minutes. The scope was then withdrawn in  1) 6.0  minutes from the cecum and the procedure completed. COMPLICATIONS:  None ENDOSCOPIC IMPRESSION: 1) Diverticula, scattered in the transverse to sigmoid 2) Internal hemorrhoids 3) Otherwise normal examination RECOMMENDATIONS: 1) Colonoscopy 10 years REPEAT EXAM:  In 10 year(s) for Colonoscopy.  ______________________________ Barbette Hair. Arlyce Dice, MD  CC:  n. eSIGNED:   Barbette Hair. Javyn Havlin at 02/12/2012 09:06  AM  Abigail Butts, [phone]

## 2012-02-12 NOTE — H&P (View-Only) (Signed)
Subjective:    Patient ID: Cynthia Farrell, female    DOB: 09/13/1946, 65 y.o.   MRN: [id number]  HPI Cynthia Farrell is a pleasant 65-year-old white female, new to our practice today. Her primary M.D. is Dr. James Kim. She had previously been seen by Dr. Orr, and relates having colonoscopy and upper endoscopy in 2006. I do not have copies of those reports at this time but she states that she did have adenomatous colon polyps and was told to followup in 3 years which she did not do 2 loss of insurance. She also has history of chronic GERD and had an esophageal stricture which he dilated at that time.  She comes in today with complaints of dark stools over the past 3 weeks. She has also had associated fatigue and intermittent lightheadedness. She has history of fibromyalgia, and had been taking a regular aspirin once daily as well as naproxen 3 times daily at 1 point which seemed to cause bruising so she decreased this to once daily. Since seeing Dr. Kim earlier in the week she's off of the aspirin and naproxen. She also had taken a course of prednisone in April . She had not been on any regular PPI but had been using Zegerid when necessary. She says she has had a lot of heartburn and indigestion recently as well as a feeling of swelling and bloating immediately postprandially. She admits to some early satiety symptoms. Also has had a couple of episodes of solid food dysphasia. She says she initially noted dark stools about 3 weeks ago and says that every bowel movement since has been dark not had any obvious blood and usually is a WBC of 12.5 hemoglobin 9.5 hematocrit of 27.6 platelets 260 MCV of 90.5 BUN was 26 she did 27 creatinine 0.79 liver function studies were normal ERP was elevated at 7 having one bowel movement per day. Her husband states that she's been extremely fatigued and unable to do much of anything over the past week. She really denies anything in the way of abdominal pain.  Labs done on 02/09/2012 showed  hemoglobin of 9.5 hematocrit of 27.6 platelets 260 MCV of 90.5 WBC of 12.5 BUN was 27 creatinine 0.79 liver function studies normal CRP elevated at 7  Review of Systems  Constitutional: Positive for activity change, appetite change and fatigue.  HENT: Positive for trouble swallowing.   Eyes: Negative.   Respiratory: Negative.   Cardiovascular: Negative.   Gastrointestinal: Positive for abdominal pain and blood in stool.  Genitourinary: Negative.   Musculoskeletal: Positive for back pain and arthralgias.  Skin: Negative.   Neurological: Positive for weakness.  Hematological: Negative.   Psychiatric/Behavioral: Negative.       Outpatient Encounter Prescriptions as of 02/11/2012  Medication Sig Dispense Refill  . diltiazem (DILACOR XR) 180 MG 24 hr capsule Take 180 mg by mouth daily.      . HYDROcodone-acetaminophen (VICODIN ES) 7.5-750 MG per tablet Take 1 tablet by mouth every 6 (six) hours as needed.      . omeprazole-sodium bicarbonate (ZEGERID) 40-1100 MG per capsule Take 1 capsule by mouth daily before breakfast.      . pravastatin (PRAVACHOL) 40 MG tablet Take 40 mg by mouth daily.      . traMADol (ULTRAM) 50 MG tablet Take 50 mg by mouth every 6 (six) hours as needed.      . valsartan (DIOVAN) 40 MG tablet Take 40 mg by mouth daily.      . zolpidem (AMBIEN)   10 MG tablet Take 10 mg by mouth at bedtime as needed.       No Known Allergies Patient Active Problem List  Diagnoses  . Fibromyalgia  . Aortic insufficiency and aortic stenosis  . Hx of breast cancer  . Hx of adenomatous colonic polyps  . GERD with stricture   History   Social History  . Marital Status: Married    Spouse Name: N/A    Number of Children: 2  . Years of Education: N/A   Occupational History  . Sales     [employer]   Social History Main Topics  . Smoking status: Never Smoker   . Smokeless tobacco: Never Used  . Alcohol Use: No  . Drug Use: No  . Sexually Active: Not on file   Other Topics  Concern  . Not on file   Social History Narrative  . No narrative on file   Past Surgical History  Procedure Date  . Appendectomy   . Cholecystectomy   . Abdominal hysterectomy   . Mastectomy   . Breast biopsy   . Thyroid cyst excision     Objective:   Physical Exam well-developed older white female in no acute distress, accompanied by her husband. Blood pressure 114/60 pulse 60 height 5 foot 2 weight 204. HEENT; nontraumatic normocephalic EOMI PERRLA sclera, anicteric conjunctiva are somewhat pale, Supple no JVD, Cardiovascular; regular rate and rhythm with S1-S2 she is a 3/6 systolic murmur, Pulmonary; clear bilaterally, Abdomen, large, soft she is tender in the left lower quadrant and also mildly in the epigastrium there is no palpable mass or hepatosplenomegaly BS sounds are present, Rectal; exam dark brown stool which is Hemoccult-positive no lesion noted on Extremities; no clubbing, cyanosis or edema skin warm and dry, Psych; mood and affect normal and appropriate.        Assessment & Plan:  #1 65-year-old female with history of dark stools x3 weeks, and new finding of a normocytic anemia with hemoglobin of 9.5  days ago. Patient has associated significant fatigue. Patient is at increased risk for peptic ulcer disease given recent aspirin and NSAID and steroid use. #2 history of adenomatous colon polyps, last colonoscopy 2006 and was requested to have followup in 3 years. Also need to rule out occult colon lesion  #3 Fibromyalgia #4 history of breast cancer #5 aortic stenosis   Plan; increase Zegerid to one twice daily Repeat CBC today, patient is symptomatic and if her hemoglobin is 8 or less she will need transfusion. Agree patient needs to stay off all aspirin and NSAIDs at this point. Have scheduled for upper endoscopy and colonoscopy with Dr. Kaplan at [facility] tomorrow to expedite her workup. Procedures were discussed in detail with the patient and her husband  including the possibility of admission.  Addendum Reviewed and agree with management. Jay M Pyrtle, MD  

## 2012-02-16 ENCOUNTER — Encounter (HOSPITAL_COMMUNITY): Payer: Self-pay | Admitting: Gastroenterology

## 2012-02-16 ENCOUNTER — Encounter: Payer: Self-pay | Admitting: Gastroenterology

## 2012-03-14 ENCOUNTER — Ambulatory Visit: Payer: Self-pay | Admitting: Internal Medicine

## 2012-03-28 ENCOUNTER — Other Ambulatory Visit (INDEPENDENT_AMBULATORY_CARE_PROVIDER_SITE_OTHER): Payer: Medicare Other

## 2012-03-28 ENCOUNTER — Ambulatory Visit (INDEPENDENT_AMBULATORY_CARE_PROVIDER_SITE_OTHER): Payer: Medicare Other | Admitting: Gastroenterology

## 2012-03-28 ENCOUNTER — Encounter: Payer: Self-pay | Admitting: Gastroenterology

## 2012-03-28 VITALS — BP 124/68 | HR 60 | Ht 62.0 in | Wt 204.0 lb

## 2012-03-28 DIAGNOSIS — D649 Anemia, unspecified: Secondary | ICD-10-CM

## 2012-03-28 DIAGNOSIS — K253 Acute gastric ulcer without hemorrhage or perforation: Secondary | ICD-10-CM

## 2012-03-28 DIAGNOSIS — K219 Gastro-esophageal reflux disease without esophagitis: Secondary | ICD-10-CM

## 2012-03-28 DIAGNOSIS — Q2733 Arteriovenous malformation of digestive system vessel: Secondary | ICD-10-CM

## 2012-03-28 DIAGNOSIS — D62 Acute posthemorrhagic anemia: Secondary | ICD-10-CM

## 2012-03-28 DIAGNOSIS — K31819 Angiodysplasia of stomach and duodenum without bleeding: Secondary | ICD-10-CM

## 2012-03-28 DIAGNOSIS — M069 Rheumatoid arthritis, unspecified: Secondary | ICD-10-CM | POA: Insufficient documentation

## 2012-03-28 DIAGNOSIS — K222 Esophageal obstruction: Secondary | ICD-10-CM

## 2012-03-28 LAB — CBC WITH DIFFERENTIAL/PLATELET
Basophils Absolute: 0.1 10*3/uL (ref 0.0–0.1)
Basophils Relative: 0.7 % (ref 0.0–3.0)
Eosinophils Absolute: 0.3 10*3/uL (ref 0.0–0.7)
Eosinophils Relative: 3.4 % (ref 0.0–5.0)
HCT: 35.3 % — ABNORMAL LOW (ref 36.0–46.0)
Hemoglobin: 11.3 g/dL — ABNORMAL LOW (ref 12.0–15.0)
Lymphocytes Relative: 31.2 % (ref 12.0–46.0)
Lymphs Abs: 2.4 10*3/uL (ref 0.7–4.0)
MCHC: 31.9 g/dL (ref 30.0–36.0)
MCV: 83.1 fl (ref 78.0–100.0)
Monocytes Absolute: 0.5 10*3/uL (ref 0.1–1.0)
Monocytes Relative: 6.9 % (ref 3.0–12.0)
Neutro Abs: 4.5 10*3/uL (ref 1.4–7.7)
Neutrophils Relative %: 57.8 % (ref 43.0–77.0)
Platelets: 323 10*3/uL (ref 150.0–400.0)
RBC: 4.25 Mil/uL (ref 3.87–5.11)
RDW: 22.1 % — ABNORMAL HIGH (ref 11.5–14.6)
WBC: 7.7 10*3/uL (ref 4.5–10.5)

## 2012-03-28 NOTE — Assessment & Plan Note (Signed)
Should patient require an NSAID I would recommend Arthrotec

## 2012-03-28 NOTE — Assessment & Plan Note (Signed)
Status post [facility] dilatation.

## 2012-03-28 NOTE — Assessment & Plan Note (Signed)
Status post cauterization with argon plasma coagulator

## 2012-03-28 NOTE — Progress Notes (Signed)
History of Present Illness:  Cynthia Farrell returns following hospitalization 6 weeks ago for a bleeding pyloric channel ulcer. She was taking Naprosyn and aspirin for rheumatoid arthritis. 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 She remains on twice a day Zegerid. Except for fatigue she has no other complaints.  A distal esophageal stricture was dilated.    Review of Systems: Pertinent positive and negative review of systems were noted in the above HPI section. All other review of systems were otherwise negative.    Current Medications, Allergies, Past Medical History, Past Surgical History, Family History and Social History were reviewed in [facility] electronic medical record  Vital signs were reviewed in today's medical record. Physical Exam: General: Well developed , well nourished, no acute distress

## 2012-03-28 NOTE — Patient Instructions (Addendum)
You will need to go to the basement for labs

## 2012-03-28 NOTE — Assessment & Plan Note (Signed)
Clinically resolved on PPI therapy.  Recommendations #1 reduce Zegerid to 40 mg daily

## 2012-03-28 NOTE — Assessment & Plan Note (Signed)
Plan followup CBC 

## 2012-03-31 ENCOUNTER — Telehealth: Payer: Self-pay | Admitting: Gastroenterology

## 2012-03-31 NOTE — Telephone Encounter (Signed)
Pt calling wanting to know when she needs an OV appt. Pt was just seen this week. Please advise.

## 2012-04-01 ENCOUNTER — Telehealth: Payer: Self-pay | Admitting: Gastroenterology

## 2012-04-01 NOTE — Telephone Encounter (Signed)
Pt aware.

## 2012-04-01 NOTE — Telephone Encounter (Signed)
Doesn't need routine f/u in the absence of sxs.  Can f/u with PCP

## 2012-04-01 NOTE — Telephone Encounter (Signed)
Forward 5 pages to Dr. Melvia Heaps for review on 04-01-12 ym

## 2012-04-09 ENCOUNTER — Emergency Department (HOSPITAL_COMMUNITY)
Admission: EM | Admit: 2012-04-09 | Discharge: 2012-04-10 | Disposition: A | Discharge status: Home / Self Care | Payer: Medicare Other | Attending: Emergency Medicine | Admitting: Emergency Medicine

## 2012-04-09 ENCOUNTER — Encounter (HOSPITAL_COMMUNITY): Payer: Self-pay | Admitting: Emergency Medicine

## 2012-04-09 DIAGNOSIS — M79669 Pain in unspecified lower leg: Secondary | ICD-10-CM

## 2012-04-09 DIAGNOSIS — M7989 Other specified soft tissue disorders: Secondary | ICD-10-CM | POA: Insufficient documentation

## 2012-04-09 DIAGNOSIS — M25569 Pain in unspecified knee: Secondary | ICD-10-CM | POA: Insufficient documentation

## 2012-04-09 DIAGNOSIS — E785 Hyperlipidemia, unspecified: Secondary | ICD-10-CM | POA: Insufficient documentation

## 2012-04-09 DIAGNOSIS — Z853 Personal history of malignant neoplasm of breast: Secondary | ICD-10-CM | POA: Insufficient documentation

## 2012-04-09 DIAGNOSIS — I10 Essential (primary) hypertension: Secondary | ICD-10-CM | POA: Insufficient documentation

## 2012-04-09 DIAGNOSIS — M79609 Pain in unspecified limb: Secondary | ICD-10-CM | POA: Insufficient documentation

## 2012-04-09 DIAGNOSIS — Z79899 Other long term (current) drug therapy: Secondary | ICD-10-CM | POA: Insufficient documentation

## 2012-04-09 NOTE — ED Notes (Signed)
Pt alert, nad, c/o swelling to left knee onset several weeks ago, seen several specialist, returns to ED with cont pain and swelling, concerns for "blood clot"

## 2012-04-10 ENCOUNTER — Ambulatory Visit (HOSPITAL_COMMUNITY)
Admission: RE | Admit: 2012-04-10 | Discharge: 2012-04-10 | Disposition: A | Discharge status: Home / Self Care | Payer: Medicare Other | Source: Ambulatory Visit | Attending: Emergency Medicine | Admitting: Emergency Medicine

## 2012-04-10 ENCOUNTER — Emergency Department (HOSPITAL_COMMUNITY): Payer: Medicare Other

## 2012-04-10 DIAGNOSIS — M7989 Other specified soft tissue disorders: Secondary | ICD-10-CM | POA: Insufficient documentation

## 2012-04-10 DIAGNOSIS — M79609 Pain in unspecified limb: Secondary | ICD-10-CM | POA: Insufficient documentation

## 2012-04-10 DIAGNOSIS — Z9181 History of falling: Secondary | ICD-10-CM | POA: Insufficient documentation

## 2012-04-10 LAB — CBC WITH DIFFERENTIAL/PLATELET
Basophils Absolute: 0.1 10*3/uL (ref 0.0–0.1)
Basophils Relative: 1 % (ref 0–1)
Eosinophils Absolute: 0.2 10*3/uL (ref 0.0–0.7)
Eosinophils Relative: 2 % (ref 0–5)
HCT: 38.9 % (ref 36.0–46.0)
Hemoglobin: 12.3 g/dL (ref 12.0–15.0)
Lymphocytes Relative: 32 % (ref 12–46)
Lymphs Abs: 3.2 10*3/uL (ref 0.7–4.0)
MCH: 26.3 pg (ref 26.0–34.0)
MCHC: 31.6 g/dL (ref 30.0–36.0)
MCV: 83.3 fL (ref 78.0–100.0)
Monocytes Absolute: 0.7 10*3/uL (ref 0.1–1.0)
Monocytes Relative: 7 % (ref 3–12)
Neutro Abs: 5.9 10*3/uL (ref 1.7–7.7)
Neutrophils Relative %: 59 % (ref 43–77)
Platelets: 273 10*3/uL (ref 150–400)
RBC: 4.67 MIL/uL (ref 3.87–5.11)
RDW: 18.7 % — ABNORMAL HIGH (ref 11.5–15.5)
WBC: 10 10*3/uL (ref 4.0–10.5)

## 2012-04-10 LAB — APTT: aPTT: 35 seconds (ref 24–37)

## 2012-04-10 LAB — COMPREHENSIVE METABOLIC PANEL
ALT: 22 U/L (ref 0–35)
AST: 18 U/L (ref 0–37)
Albumin: 4.1 g/dL (ref 3.5–5.2)
Alkaline Phosphatase: 175 U/L — ABNORMAL HIGH (ref 39–117)
BUN: 19 mg/dL (ref 6–23)
CO2: 27 mEq/L (ref 19–32)
Calcium: 9.7 mg/dL (ref 8.4–10.5)
Chloride: 101 mEq/L (ref 96–112)
Creatinine, Ser: 0.89 mg/dL (ref 0.50–1.10)
GFR calc Af Amer: 77 mL/min — ABNORMAL LOW (ref >90)
GFR calc non Af Amer: 67 mL/min — ABNORMAL LOW (ref >90)
Glucose, Bld: 124 mg/dL — ABNORMAL HIGH (ref 70–99)
Potassium: 4.7 mEq/L (ref 3.5–5.1)
Sodium: 136 mEq/L (ref 135–145)
Total Bilirubin: 0.2 mg/dL — ABNORMAL LOW (ref 0.3–1.2)
Total Protein: 7.2 g/dL (ref 6.0–8.3)

## 2012-04-10 MED ORDER — ENOXAPARIN SODIUM 150 MG/ML ~~LOC~~ SOLN
1.5000 mg/kg | Freq: Once | SUBCUTANEOUS | Status: AC
  Administered 2012-04-10: 140 mg via SUBCUTANEOUS
  Filled 2012-04-10: qty 1

## 2012-04-10 NOTE — ED Provider Notes (Signed)
History     CSN: [id number]  Arrival date & time 04/09/12  2240   First MD Initiated Contact with Patient 04/10/12 0229      Chief Complaint  Patient presents with  . Knee pain and swelling     (Consider location/radiation/quality/duration/timing/severity/associated sxs/prior treatment) HPI Comments: Patient presents emergency department with chief complaint of left knee pain and left leg swelling.  Patient states the pain is located in her anterior knee as well as her calf and shin.  Fall triggering pain occurred approximately 2 weeks ago.  At that time there was some bruising but no swelling.  Patient developed left leg swelling yesterday that has gradually worsened today.  She describes her left leg is stiff and swollen.  She has no history of DVT or PE and denies any shortness of breath, chest pain, dyspnea on exertion, fever, night sweats, chills, decreased range of motion, or inability to ambulate.  Patient has no other complaints at this time.  The history is provided by the patient.    Past Medical History  Diagnosis Date  . Atrial fibrillation   . Breast cancer   . Fibromyalgia   . Rheumatoid aortitis   . Hyperlipemia   . Hypertension   . GERD (gastroesophageal reflux disease)   . Esophageal stricture   . Hemorrhoids   . Diverticular disease     Past Surgical History  Procedure Date  . Appendectomy   . Cholecystectomy   . Abdominal hysterectomy   . Mastectomy   . Breast biopsy   . Thyroid cyst excision   . Colonoscopy 02/12/2012    Procedure: COLONOSCOPY;  Surgeon: Louis Meckel, MD;  Location: [facility];  Service: Endoscopy;  Laterality: N/A;  . Esophagogastroduodenoscopy 02/12/2012    Procedure: ESOPHAGOGASTRODUODENOSCOPY (EGD);  Surgeon: Louis Meckel, MD;  Location: [facility];  Service: Endoscopy;  Laterality: N/A;  . Hot hemostasis 02/12/2012    Procedure: HOT HEMOSTASIS (ARGON PLASMA COAGULATION/BICAP);  Surgeon: Louis Meckel, MD;  Location: [facility];  Service: Endoscopy;  Laterality: N/A;    Family History  Problem Relation Age of Onset  . Diabetes Mother   . Heart disease Mother   . Heart disease Father   . Kidney disease Maternal Grandmother   . Liver disease Maternal Grandfather   . Heart disease Paternal Grandmother   . Breast cancer Paternal Grandmother   . Heart disease Paternal Grandfather   . Colon cancer Neg Hx     History  Substance Use Topics  . Smoking status: Never Smoker   . Smokeless tobacco: Never Used  . Alcohol Use: No    OB History    Grav Para Term Preterm Abortions TAB SAB Ect Mult Living                  Review of Systems  All other systems reviewed and are negative.    Allergies  Prednisone  Home Medications   Current Outpatient Rx  Name Route Sig Dispense Refill  . CYANOCOBALAMIN 2000 MCG PO TABS Oral Take 2,000 mcg by mouth daily.    Marland Kitchen DILTIAZEM HCL ER 180 MG PO CP24 Oral Take 180 mg by mouth daily.    Marland Kitchen IRON-150 PO Oral Take 1 capsule by mouth daily.    Marland Kitchen FOLIC ACID 800 MCG PO TABS Oral Take 800 mcg by mouth daily.    Marland Kitchen HYDROCODONE-ACETAMINOPHEN 7.5-750 MG PO TABS Oral Take 1 tablet by mouth every 6 (six) hours as needed.    Marland Kitchen  OMEPRAZOLE-SODIUM BICARBONATE 40-1100 MG PO CAPS Oral Take 1 capsule by mouth 2 (two) times daily.     Marland Kitchen PRAVASTATIN SODIUM 40 MG PO TABS Oral Take 40 mg by mouth daily.    . TRAMADOL HCL 50 MG PO TABS Oral Take 50 mg by mouth 2 (two) times daily. As directed    . VALSARTAN 40 MG PO TABS Oral Take 40 mg by mouth daily.    Marland Kitchen ZOLPIDEM TARTRATE 10 MG PO TABS Oral Take 10 mg by mouth at bedtime as needed.      BP 150/92  Pulse 63  Temp 97.3 F (36.3 C) (Axillary)  Resp 20  SpO2 97%  Physical Exam  Constitutional: She is oriented to person, place, and time. She appears well-developed and well-nourished. No distress.  HENT:  Head: Normocephalic and atraumatic.  Mouth/Throat: Oropharynx is clear and moist. No oropharyngeal exudate.  Eyes:  Conjunctivae and EOM are normal. Pupils are equal, round, and reactive to light. No scleral icterus.  Neck: Normal range of motion. Neck supple. No tracheal deviation present. No thyromegaly present.  Cardiovascular: Normal rate, regular rhythm and normal heart sounds.        Left leg with obvious swelling, non pitting edema, intact distal pulses  Pulmonary/Chest: Effort normal and breath sounds normal. No stridor. No respiratory distress. She has no wheezes.  Abdominal: Soft.  Musculoskeletal: Normal range of motion. She exhibits tenderness. She exhibits no edema.       Or tenderness to palpation of the left anterior knee, shin, and calf.  Normal range of motion.  Neurological: She is alert and oriented to person, place, and time. Coordination normal.  Skin: Skin is warm and dry. No rash noted. She is not diaphoretic. No erythema. No pallor.  Psychiatric: She has a normal mood and affect. Her behavior is normal.    ED Course  Procedures (including critical care time)  Labs Reviewed  CBC WITH DIFFERENTIAL - Abnormal; Notable for the following:    RDW 18.7 (*)     All other components within normal limits  COMPREHENSIVE METABOLIC PANEL - Abnormal; Notable for the following:    Glucose, Bld 124 (*)     Alkaline Phosphatase 175 (*)     Total Bilirubin 0.2 (*)     GFR calc non Af Amer 67 (*)     GFR calc Af Amer 77 (*)     All other components within normal limits  APTT   Dg Knee Complete 4 Views Left  04/10/2012  *RADIOLOGY REPORT*  Clinical Data: Anterior knee pain and swelling after fall.  LEFT KNEE - COMPLETE 4+ VIEW  Comparison: None.  Findings: Degenerative changes with narrowed medial compartment and osteophytes.  No evidence of acute fracture or subluxation.  No focal bone lesion or bone destruction.  Bone cortex and trabecular architecture appear intact.  No significant effusion.  IMPRESSION: Mild degenerative changes.  No acute bony abnormalities appreciated.  Original Report  Authenticated By: Marlon Pel, M.D.     No diagnosis found.    MDM  Left leg swelling  Knee, calf pain   Pt treated in the ED for DVT with Lovenox. Pt to follow up at [facility] to confirm dx. Presentation non concerning for PE, not tachycardic and denies SOB, DOE, & CP. Strict return precautions discussed. Pt seen by and discussed with Dr. Read Drivers who agrees with above plan. Pt is with nl VS and in NAD prior to dc.  Jaci Carrel, [place] 04/10/12 [phone]

## 2012-04-10 NOTE — ED Notes (Signed)
Pt complains of chills, and staff took pt temp, temp is 97.3. Staff gave pt a warm blanket.

## 2012-04-10 NOTE — ED Provider Notes (Signed)
Medical screening examination/treatment/procedure(s) were conducted as a shared visit with non-physician practitioner(s) and myself.  I personally evaluated the patient during the encounter  4:34 AM Left leg edematous compared to right leg. No cyanosis or erythema. We will set the patient up for the outpatient DVT protocol.    Hanley Seamen, MD 04/10/12 [phone]

## 2012-04-10 NOTE — Progress Notes (Signed)
VASCULAR LAB PRELIMINARY  PRELIMINARY  PRELIMINARY  PRELIMINARY  Left lower extremity venous duplex completed.    Preliminary report:  Left:  No evidence of DVT, superficial thrombosis, or Baker's cyst.  Nowell Sites, RVT 04/10/2012, 8:29 AM

## 2012-06-27 ENCOUNTER — Other Ambulatory Visit: Payer: Self-pay | Admitting: Internal Medicine

## 2012-06-28 ENCOUNTER — Other Ambulatory Visit: Payer: Self-pay | Admitting: Internal Medicine

## 2012-06-28 DIAGNOSIS — N63 Unspecified lump in unspecified breast: Secondary | ICD-10-CM

## 2012-06-28 DIAGNOSIS — Z9011 Acquired absence of right breast and nipple: Secondary | ICD-10-CM

## 2012-06-30 ENCOUNTER — Other Ambulatory Visit: Payer: Self-pay | Admitting: Internal Medicine

## 2012-06-30 ENCOUNTER — Ambulatory Visit
Admission: RE | Admit: 2012-06-30 | Discharge: 2012-06-30 | Disposition: A | Discharge status: Home / Self Care | Payer: Medicare Other | Source: Ambulatory Visit | Attending: Internal Medicine | Admitting: Internal Medicine

## 2012-06-30 DIAGNOSIS — Z9011 Acquired absence of right breast and nipple: Secondary | ICD-10-CM

## 2012-06-30 DIAGNOSIS — N63 Unspecified lump in unspecified breast: Secondary | ICD-10-CM

## 2012-07-06 ENCOUNTER — Other Ambulatory Visit: Payer: Self-pay

## 2013-03-03 ENCOUNTER — Other Ambulatory Visit (HOSPITAL_COMMUNITY): Payer: Self-pay | Admitting: Internal Medicine

## 2013-03-03 DIAGNOSIS — R7402 Elevation of levels of lactic acid dehydrogenase (LDH): Secondary | ICD-10-CM

## 2013-03-03 DIAGNOSIS — R7401 Elevation of levels of liver transaminase levels: Secondary | ICD-10-CM

## 2013-03-13 ENCOUNTER — Encounter (HOSPITAL_COMMUNITY): Payer: Medicare Other

## 2013-04-10 ENCOUNTER — Encounter (HOSPITAL_COMMUNITY): Payer: Medicare Other

## 2013-11-01 ENCOUNTER — Other Ambulatory Visit: Payer: Self-pay

## 2013-11-01 DIAGNOSIS — Z9011 Acquired absence of right breast and nipple: Secondary | ICD-10-CM

## 2013-11-01 DIAGNOSIS — Z1231 Encounter for screening mammogram for malignant neoplasm of breast: Secondary | ICD-10-CM

## 2013-11-06 ENCOUNTER — Ambulatory Visit: Payer: Medicare Other

## 2013-11-22 ENCOUNTER — Ambulatory Visit
Admission: RE | Admit: 2013-11-22 | Discharge: 2013-11-22 | Disposition: A | Discharge status: Home / Self Care | Payer: Medicare Other | Source: Ambulatory Visit

## 2013-11-22 DIAGNOSIS — Z1231 Encounter for screening mammogram for malignant neoplasm of breast: Secondary | ICD-10-CM

## 2013-11-22 DIAGNOSIS — Z9011 Acquired absence of right breast and nipple: Secondary | ICD-10-CM

## 2014-12-19 ENCOUNTER — Other Ambulatory Visit: Payer: Self-pay

## 2014-12-19 DIAGNOSIS — Z1231 Encounter for screening mammogram for malignant neoplasm of breast: Secondary | ICD-10-CM

## 2015-01-02 ENCOUNTER — Ambulatory Visit
Admission: RE | Admit: 2015-01-02 | Discharge: 2015-01-02 | Disposition: A | Discharge status: Home / Self Care | Payer: Medicare Other | Source: Ambulatory Visit

## 2015-01-02 ENCOUNTER — Other Ambulatory Visit: Payer: Self-pay

## 2015-01-02 DIAGNOSIS — Z1231 Encounter for screening mammogram for malignant neoplasm of breast: Secondary | ICD-10-CM

## 2015-01-16 ENCOUNTER — Ambulatory Visit (INDEPENDENT_AMBULATORY_CARE_PROVIDER_SITE_OTHER): Payer: Medicare Other | Admitting: Neurology

## 2015-01-16 ENCOUNTER — Encounter: Payer: Self-pay | Admitting: Neurology

## 2015-01-16 VITALS — BP 146/81 | HR 53 | Ht 62.0 in | Wt 198.5 lb

## 2015-01-16 DIAGNOSIS — R42 Dizziness and giddiness: Secondary | ICD-10-CM

## 2015-01-16 DIAGNOSIS — R404 Transient alteration of awareness: Secondary | ICD-10-CM | POA: Diagnosis not present

## 2015-01-16 DIAGNOSIS — R569 Unspecified convulsions: Secondary | ICD-10-CM

## 2015-01-16 DIAGNOSIS — R4789 Other speech disturbances: Secondary | ICD-10-CM

## 2015-01-16 DIAGNOSIS — H93A1 Pulsatile tinnitus, right ear: Secondary | ICD-10-CM

## 2015-01-16 DIAGNOSIS — I6350 Cerebral infarction due to unspecified occlusion or stenosis of unspecified cerebral artery: Secondary | ICD-10-CM | POA: Diagnosis not present

## 2015-01-16 DIAGNOSIS — R55 Syncope and collapse: Secondary | ICD-10-CM | POA: Diagnosis not present

## 2015-01-16 DIAGNOSIS — H9311 Tinnitus, right ear: Secondary | ICD-10-CM | POA: Diagnosis not present

## 2015-01-16 DIAGNOSIS — R519 Headache, unspecified: Secondary | ICD-10-CM

## 2015-01-16 DIAGNOSIS — R51 Headache: Secondary | ICD-10-CM

## 2015-01-16 NOTE — Progress Notes (Signed)
[facility]    Provider:  Dr Jaynee Eagles Referring Provider: Jani Gravel, MD Primary Care Physician:  Jani Gravel, MD  CC:  Dizzy spells  HPI:  Cynthia Farrell is a 69 y.o. female here as a referral from Dr. Maudie Mercury for altered awareness. 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   First episode 5 months ago on Christmas. She got dizzy, thought she was going to pass out. Felt light headed. Felt like she was going to black out. Grabbed the counter. Felt tired afterwards but had a long day. The next week had similar episode. Had about 3 episodes, lasted 3-5 minutes with the room spinning. Sat down, closed her eyes, felt like she would fall. Not positional, or from sitting to standing. It "hits her", the room spins. No nausea, vomiting. Feels very tired and she can't think clearly. She has to sit and calm down. She is afraid she might pass out. She can walk but is unsteady, feels like she is staggering. Her speech gets slurred. Has word-finding difficulty. She gets grey. She has cardiac, irregular heart rate and mitral/aortic valve problems. Symptoms happen when she is sitting reading a book. Not exertional. No CP, SOB. Worsening , getting more frequent. Pulsatile tinnitus in the right ear. Headaches. No PMHx of seizures. Niece with seizures. No other focal neurologic symptoms.Uncle with seizures. No Hx of migraines.  Reviewed notes, labs and imaging from outside physicians, which showed:     CT Head 2006 showed No acute intracranial abnormalities including mass lesion or mass effect, hydrocephalus, extra-axial fluid collection, midline shift, hemorrhage, or acute infarction, large ischemic events (personally reviewed images)    Review of Systems: Patient complains of symptoms per HPI as well as the following  symptoms: Vision, murmur, joint pain, joint swelling, aching muscles, memory loss, confusion, numbness, slurred speech, dizziness, tremor. Pertinent negatives per HPI. All others negative.   History   Social History  . Marital Status: Married    Spouse Name: Whitnee Orzel.  . Number of Children: 2  . Years of Education: 12   Occupational History  . Sales     [employer]   Social History Main Topics  . Smoking status: Never Smoker   . Smokeless tobacco: Never Used  . Alcohol Use: No  . Drug Use: No  . Sexual Activity: Not on file   Other Topics Concern  . Not on file   Social History Narrative   Lives at home with husband.   Caffeine use: Drinks decaf tea   Coffee very rarely    Family History  Problem Relation Age of Onset  . Diabetes Mother   . Heart disease Mother   . Heart disease Father   . Kidney disease Maternal Grandmother   . Liver disease Maternal Grandfather   . Heart disease Paternal Grandmother   . Breast cancer Paternal Grandmother   . Heart disease Paternal Grandfather   . Colon cancer Neg Hx     Past Medical History  Diagnosis Date  . Atrial fibrillation   . Breast cancer   . Fibromyalgia   . Rheumatoid aortitis   . Hyperlipemia   . Hypertension   . GERD (gastroesophageal reflux disease)   . Esophageal stricture   . Hemorrhoids   . Diverticular disease     Past Surgical History  Procedure Laterality Date  . Appendectomy    .  Cholecystectomy    . Abdominal hysterectomy    . Mastectomy    . Breast biopsy    . Thyroid cyst excision    . Colonoscopy  02/12/2012    Procedure: COLONOSCOPY;  Surgeon: Inda Castle, MD;  Location: [facility];  Service: Endoscopy;  Laterality: N/A;  . Esophagogastroduodenoscopy  02/12/2012    Procedure: ESOPHAGOGASTRODUODENOSCOPY (EGD);  Surgeon: Inda Castle, MD;  Location: [facility];  Service: Endoscopy;  Laterality: N/A;  . Hot hemostasis  02/12/2012    Procedure: HOT HEMOSTASIS (ARGON PLASMA  COAGULATION/BICAP);  Surgeon: Inda Castle, MD;  Location: [facility];  Service: Endoscopy;  Laterality: N/A;    Current Outpatient Prescriptions  Medication Sig Dispense Refill  . atorvastatin (LIPITOR) 40 MG tablet Take 40 mg by mouth daily.  2  . cyanocobalamin 2000 MCG tablet Take 2,000 mcg by mouth daily.    Marland Kitchen diltiazem (CARDIZEM CD) 120 MG 24 hr capsule Take 120 mg by mouth 2 (two) times daily.  2  . folic acid (FOLVITE) 621 MCG tablet Take 800 mcg by mouth daily.    Marland Kitchen HYDROcodone-acetaminophen (VICODIN ES) 7.5-750 MG per tablet Take 1 tablet by mouth every 6 (six) hours as needed.    . leflunomide (ARAVA) 20 MG tablet Take 20 mg by mouth daily.  4  . NEOMYCIN-POLYMYXIN-HYDROCORTISONE (CORTISPORIN) 1 % SOLN otic solution Place 4 drops into the right ear 3 (three) times daily.  0  . omeprazole-sodium bicarbonate (ZEGERID) 40-1100 MG per capsule Take 1 capsule by mouth 2 (two) times daily.     . traMADol (ULTRAM) 50 MG tablet Take 50 mg by mouth 2 (two) times daily. As directed    . valsartan (DIOVAN) 80 MG tablet Take 80 mg by mouth daily. Patient takes 1/2 tab per day. (40mg  per day)  11  . Vitamin D, Ergocalciferol, (DRISDOL) 50000 UNITS CAPS capsule Take 50,000 Units by mouth once a week.  12  . zolpidem (AMBIEN) 10 MG tablet Take 10 mg by mouth at bedtime as needed.    Marland Kitchen amoxicillin (AMOXIL) 500 MG capsule   0  . Fe Bisgly-Succ-C-Thre-B12-FA (IRON-150 PO) Take 1 capsule by mouth daily.    . pravastatin (PRAVACHOL) 40 MG tablet Take 40 mg by mouth daily.     No current facility-administered medications for this visit.    Allergies as of 01/16/2015 - Review Complete 01/16/2015  Allergen Reaction Noted  . Prednisone Nausea Only 02/12/2012    Vitals: There were no vitals taken for this visit. Last Weight:  Wt Readings from Last 1 Encounters:  04/10/12 204 lb (92.534 kg)   Last Height:   Ht Readings from Last 1 Encounters:  03/28/12 5\' 2"  (1.575 m)   Physical  exam: Exam: Gen: NAD, conversant, well nourised, obese, well groomed                     CV: RRR, +SEM. No Carotid Bruits. No peripheral edema, warm, nontender Eyes: Conjunctivae clear without exudates or hemorrhage  Neuro: Detailed Neurologic Exam  Speech:    Speech is normal; fluent and spontaneous with normal comprehension.  Cognition:    The patient is oriented to person, place, and time;     recent and remote memory intact;     language fluent;     normal attention, concentration,     fund of knowledge Cranial Nerves:    The pupils are equal, round, and reactive to light. The fundi are normal and spontaneous venous  pulsations are present. Visual fields are full to finger confrontation. Extraocular movements are intact. Trigeminal sensation is intact and the muscles of mastication are normal. The face is symmetric. The palate elevates in the midline. Hearing intact. Voice is normal. Shoulder shrug is normal. The tongue has normal motion without fasciculations.   Coordination:    Normal finger to nose and heel to shin. Normal rapid alternating movements.   Gait:    Heel-toe and tandem gait are normal.   Motor Observation:    No asymmetry, no atrophy, and no involuntary movements noted. Tone:    Normal muscle tone.    Posture:    Posture is normal. normal erect    Strength:    Strength is V/V in the upper and lower limbs.      Sensation: intact to LT     Reflex Exam:  DTR's:    Deep tendon reflexes in the upper and lower extremities are normal bilaterally.   Toes:    The toes are downgoing bilaterally.   Clonus:    Clonus is absent.     Assessment/Plan:    JESSAMY TOROSYAN is a 69 y.o. female here as a referral from Dr. Maudie Mercury for altered awareness. 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  She is having episodic presyncopal episodes, vertiginous episodes.  Given significant past medical history of cardiac disease highly recommend evaluation and follow-up with cardiology for presyncopal episodes. Given significant vascular risk factors as well as atrial fibrillation, we'll order MRI of the brain with and without contrast and MRA of the head without contrast as well as bilateral carotid Dopplers. Service report periods of confusion, altered awareness, we'll order an EEG to evaluate for epileptiform activity. We'll order CMP.   Cynthia Ill, MD  [facility] [address] [place], [place] [address]  Phone [phone] Fax [phone]

## 2015-01-16 NOTE — Patient Instructions (Signed)
Remember to drink plenty of fluid, eat healthy meals and do not skip any meals. Try to eat protein with a every meal and eat a healthy snack such as fruit or nuts in between meals. Try to keep a regular sleep-wake schedule and try to exercise daily, particularly in the form of walking, 20-30 minutes a day, if you can.   As far as diagnostic testing: MRi brain, MRA head, EEG, Carotid Dopplers, lab  I would like to see you back in 3 months, sooner if we need to. Please call us with any interim questions, concerns, problems, updates or refill requests.   Please also call us for any test results so we can go over those with you on the phone.  My clinical assistant and will answer any of your questions and relay your messages to me and also relay most of my messages to you.   Our phone number is [phone]. We also have an after hours call service for urgent matters and there is a physician on-call for urgent questions. For any emergencies you know to call 911 or go to the nearest emergency room

## 2015-01-24 ENCOUNTER — Other Ambulatory Visit: Payer: Medicare Other

## 2016-01-02 DIAGNOSIS — M719 Bursopathy, unspecified: Secondary | ICD-10-CM | POA: Diagnosis not present

## 2016-01-02 DIAGNOSIS — M654 Radial styloid tenosynovitis [de Quervain]: Secondary | ICD-10-CM | POA: Diagnosis not present

## 2016-01-02 DIAGNOSIS — M0609 Rheumatoid arthritis without rheumatoid factor, multiple sites: Secondary | ICD-10-CM | POA: Diagnosis not present

## 2016-01-02 DIAGNOSIS — M25522 Pain in left elbow: Secondary | ICD-10-CM | POA: Diagnosis not present

## 2016-02-05 DIAGNOSIS — H43393 Other vitreous opacities, bilateral: Secondary | ICD-10-CM | POA: Diagnosis not present

## 2016-02-05 DIAGNOSIS — H2512 Age-related nuclear cataract, left eye: Secondary | ICD-10-CM | POA: Diagnosis not present

## 2016-02-05 DIAGNOSIS — H40013 Open angle with borderline findings, low risk, bilateral: Secondary | ICD-10-CM | POA: Diagnosis not present

## 2016-02-05 DIAGNOSIS — H25811 Combined forms of age-related cataract, right eye: Secondary | ICD-10-CM | POA: Diagnosis not present

## 2016-02-07 ENCOUNTER — Other Ambulatory Visit: Payer: Self-pay

## 2016-02-07 DIAGNOSIS — Z1231 Encounter for screening mammogram for malignant neoplasm of breast: Secondary | ICD-10-CM

## 2016-02-20 DIAGNOSIS — J069 Acute upper respiratory infection, unspecified: Secondary | ICD-10-CM | POA: Diagnosis not present

## 2016-02-21 ENCOUNTER — Ambulatory Visit
Admission: RE | Admit: 2016-02-21 | Discharge: 2016-02-21 | Disposition: A | Discharge status: Home / Self Care | Payer: Medicare Other | Source: Ambulatory Visit

## 2016-02-21 DIAGNOSIS — Z1231 Encounter for screening mammogram for malignant neoplasm of breast: Secondary | ICD-10-CM

## 2016-03-06 DIAGNOSIS — Q248 Other specified congenital malformations of heart: Secondary | ICD-10-CM | POA: Diagnosis not present

## 2016-03-06 DIAGNOSIS — R0609 Other forms of dyspnea: Secondary | ICD-10-CM | POA: Diagnosis not present

## 2016-03-06 DIAGNOSIS — R001 Bradycardia, unspecified: Secondary | ICD-10-CM | POA: Diagnosis not present

## 2016-03-25 DIAGNOSIS — I10 Essential (primary) hypertension: Secondary | ICD-10-CM | POA: Diagnosis not present

## 2016-03-25 DIAGNOSIS — E559 Vitamin D deficiency, unspecified: Secondary | ICD-10-CM | POA: Diagnosis not present

## 2016-03-25 DIAGNOSIS — M0609 Rheumatoid arthritis without rheumatoid factor, multiple sites: Secondary | ICD-10-CM | POA: Diagnosis not present

## 2016-03-25 DIAGNOSIS — Z0000 Encounter for general adult medical examination without abnormal findings: Secondary | ICD-10-CM | POA: Diagnosis not present

## 2016-03-30 DIAGNOSIS — M0609 Rheumatoid arthritis without rheumatoid factor, multiple sites: Secondary | ICD-10-CM | POA: Diagnosis not present

## 2016-03-30 DIAGNOSIS — E7800 Pure hypercholesterolemia, unspecified: Secondary | ICD-10-CM | POA: Diagnosis not present

## 2016-03-30 DIAGNOSIS — I10 Essential (primary) hypertension: Secondary | ICD-10-CM | POA: Diagnosis not present

## 2016-03-30 DIAGNOSIS — K7689 Other specified diseases of liver: Secondary | ICD-10-CM | POA: Diagnosis not present

## 2016-04-14 ENCOUNTER — Other Ambulatory Visit: Payer: Self-pay | Admitting: Vascular Surgery

## 2016-04-14 DIAGNOSIS — I890 Lymphedema, not elsewhere classified: Secondary | ICD-10-CM

## 2016-05-04 DIAGNOSIS — M0609 Rheumatoid arthritis without rheumatoid factor, multiple sites: Secondary | ICD-10-CM | POA: Diagnosis not present

## 2016-05-04 DIAGNOSIS — M25561 Pain in right knee: Secondary | ICD-10-CM | POA: Diagnosis not present

## 2016-05-04 DIAGNOSIS — M1990 Unspecified osteoarthritis, unspecified site: Secondary | ICD-10-CM | POA: Diagnosis not present

## 2016-05-04 DIAGNOSIS — M654 Radial styloid tenosynovitis [de Quervain]: Secondary | ICD-10-CM | POA: Diagnosis not present

## 2016-05-12 ENCOUNTER — Encounter: Payer: Self-pay | Admitting: Vascular Surgery

## 2016-05-15 ENCOUNTER — Ambulatory Visit (INDEPENDENT_AMBULATORY_CARE_PROVIDER_SITE_OTHER): Payer: Medicare Other | Admitting: Vascular Surgery

## 2016-05-15 ENCOUNTER — Encounter: Payer: Self-pay | Admitting: Vascular Surgery

## 2016-05-15 ENCOUNTER — Ambulatory Visit (HOSPITAL_COMMUNITY)
Admission: RE | Admit: 2016-05-15 | Discharge: 2016-05-15 | Disposition: A | Discharge status: Home / Self Care | Payer: Medicare Other | Source: Ambulatory Visit | Attending: Vascular Surgery | Admitting: Vascular Surgery

## 2016-05-15 VITALS — BP 130/80 | HR 52 | Temp 97.6°F | Resp 20 | Ht 62.0 in | Wt 200.0 lb

## 2016-05-15 DIAGNOSIS — I890 Lymphedema, not elsewhere classified: Secondary | ICD-10-CM | POA: Insufficient documentation

## 2016-05-15 DIAGNOSIS — I8391 Asymptomatic varicose veins of right lower extremity: Secondary | ICD-10-CM | POA: Diagnosis not present

## 2016-05-15 DIAGNOSIS — I872 Venous insufficiency (chronic) (peripheral): Secondary | ICD-10-CM | POA: Insufficient documentation

## 2016-05-15 DIAGNOSIS — R609 Edema, unspecified: Secondary | ICD-10-CM | POA: Diagnosis present

## 2016-05-15 NOTE — Progress Notes (Signed)
Referred by:  Jani Gravel, MD [place], [place] [address]  Reason for referral: Right LE edema with history of right UE lymphedema s/p mastectomy.   History of Present Illness  Cynthia Farrell is a 70 y.o. ([birth year]) female who presents with chief complaint: right LE swelling.  Patient notes, onset of swelling 12 months ago, associated with sitting or standing for long periods of time.  The patient's symptoms include: swelling of the right LE.  The patient has had no history of DVT, has a history of pregnancy, no history of varicose vein, no history of venous stasis ulcers, positive history of  Lymphedema s/p right mastectomy and no history of skin changes in lower legs.  There is no family history of venous disorders.  The patient has not used compression stockings in the past.  She has a past medical history of A fib not on anticoagulation, HTN managed with Cardizem, hyperlipidemia managed with Pravastatin.   Past Medical History:  Diagnosis Date  . Atrial fibrillation ([place])   . Breast cancer ([place])   . Diverticular disease   . Esophageal stricture   . Fibromyalgia   . GERD (gastroesophageal reflux disease)   . Hemorrhoids   . Hyperlipemia   . Hypertension   . Rheumatoid aortitis [facility])     Past Surgical History:  Procedure Laterality Date  . ABDOMINAL HYSTERECTOMY  1995  . APPENDECTOMY  1956  . BREAST BIOPSY  1972  . [place] & 2005  . CHOLECYSTECTOMY  1999  . COLONOSCOPY  02/12/2012   Procedure: COLONOSCOPY;  Surgeon: Inda Castle, MD;  Location: [facility] ENDOSCOPY;  Service: Endoscopy;  Laterality: N/A;  . ESOPHAGOGASTRODUODENOSCOPY  02/12/2012   Procedure: ESOPHAGOGASTRODUODENOSCOPY (EGD);  Surgeon: Inda Castle, MD;  Location: [facility];  Service: Endoscopy;  Laterality: N/A;  . HOT HEMOSTASIS  02/12/2012   Procedure: HOT HEMOSTASIS (ARGON PLASMA COAGULATION/BICAP);  Surgeon: Inda Castle, MD;  Location: [facility];   Service: Endoscopy;  Laterality: N/A;  . MASTECTOMY  1986  . THYROID CYST EXCISION  2000  . TONSILLECTOMY  1968    Social History   Social History  . Marital status: Married    Spouse name: Circe Iwai.  . Number of children: 2  . 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 Smoking status: Never Smoker  . Smokeless tobacco: Never Used  . Alcohol use No  . Drug use: No  . Sexual activity: Not on file   Other Topics Concern  . Not on file   Social History Narrative   Lives at home with husband.   Caffeine use: Drinks decaf tea   Coffee very rarely     Family History  Problem Relation Age of Onset  . Diabetes Mother   . Heart disease Mother   . Heart disease Father   . Kidney disease Maternal Grandmother   . Liver disease Maternal Grandfather   . Heart disease Paternal Grandmother   . Breast cancer Paternal Grandmother   . Heart disease Paternal Grandfather   . Colon cancer Neg Hx     Current Outpatient Prescriptions  Medication Sig Dispense Refill  . aspirin EC 81 MG tablet Take 81 mg by mouth daily.    Marland Kitchen atorvastatin (LIPITOR) 40 MG tablet Take 40 mg by mouth daily.  2  . cyanocobalamin 2000 MCG tablet Take  1,000 mcg by mouth daily.     Marland Kitchen diltiazem (CARDIZEM CD) 120 MG 24 hr capsule Take 120 mg by mouth 2 (two) times daily.  2  . Fe Bisgly-Succ-C-Thre-B12-FA (IRON-150 PO) Take 1 capsule by mouth daily.    . folic acid (FOLVITE) Q000111Q MCG tablet Take 800 mcg by mouth daily.    Marland Kitchen leflunomide (ARAVA) 10 MG tablet Take 10 mg by mouth daily.    . methotrexate (RHEUMATREX) 2.5 MG tablet Take 2.5 mg by mouth. Caution:Chemotherapy. Protect from light.    3 tablets once a week    . omeprazole-sodium bicarbonate (ZEGERID) 40-1100 MG per capsule Take 1 capsule by mouth 2 (two) times daily.     . traMADol (ULTRAM) 50 MG tablet Take 50 mg by mouth 3 (three) times daily. As directed    . valsartan (DIOVAN) 80  MG tablet Take 80 mg by mouth daily. Patient takes 1/2 tab per day. (40mg  per day)  11  . zolpidem (AMBIEN) 10 MG tablet Take 10 mg by mouth at bedtime as needed.    Marland Kitchen amoxicillin (AMOXIL) 500 MG capsule Take 500 mg by mouth. Take 4 capsules prior to having teeth cleaned or dental procedure    . HYDROcodone-acetaminophen (VICODIN ES) 7.5-750 MG per tablet Take 1 tablet by mouth every 6 (six) hours as needed.    . NEOMYCIN-POLYMYXIN-HYDROCORTISONE (CORTISPORIN) 1 % SOLN otic solution Place 4 drops into the right ear 3 (three) times daily.  0  . pravastatin (PRAVACHOL) 40 MG tablet Take 40 mg by mouth daily.    . Vitamin D, Ergocalciferol, (DRISDOL) 50000 UNITS CAPS capsule Take 50,000 Units by mouth once a week.  12   No current facility-administered medications for this visit.     Allergies  Allergen Reactions  . Prednisone Nausea Only    fatigue     REVIEW OF SYSTEMS:  (Positives checked otherwise negative)  CARDIOVASCULAR:   [ ]  chest pain,  [ ]  chest pressure,  [ ]  palpitations,  [ ]  shortness of breath when laying flat,  [ ]  shortness of breath with exertion,   [ ]  pain in feet when walking,  [ ]  pain in feet when laying flat, [ ]  history of blood clot in veins (DVT),  [ ]  history of phlebitis,  [x ] swelling in legs,  [ ]  varicose veins  PULMONARY:   [ ]  productive cough,  [ ]  asthma,  [ ]  wheezing  NEUROLOGIC:   [ ]  weakness in arms or legs,  [ ]  numbness in arms or legs,  [ ]  difficulty speaking or slurred speech,  [ ]  temporary loss of vision in one eye,  [ ]  dizziness  HEMATOLOGIC:   [ ]  bleeding problems,  [ ]  problems with blood clotting too easily  MUSCULOSKEL:   [x ] joint pain, [ ]  joint swelling  GASTROINTEST:   [ ]  vomiting blood,  [ ]  blood in stool     GENITOURINARY:   [ ]  burning with urination,  [ ]  blood in urine  PSYCHIATRIC:   [ ]  history of major depression  INTEGUMENTARY:   [ ]  rashes,  [ ]  ulcers  CONSTITUTIONAL:   [ ]   fever,  [ ]  chills   Physical Examination  Vitals:   05/15/16 1305  BP: 130/80  Pulse: (!) 52  Resp: 20  Temp: 97.6 F (36.4 C)  TempSrc: Oral  SpO2: 97%  Weight: 200 lb (90.7 kg)  Height: 5\' 2"  (1.575 m)  Body mass index is 36.58 kg/m.  General: A&O x 3, WD  Head: [place]/AT    Eyes: PERRLA  Neck: Supple, no nuchal rigidity  Pulmonary: Sym exp, good air movt, CTAB, no rales, rhonchi, & wheezing  Cardiac: RRR, Nl S1, S2, no Murmurs, rubs or gallops  Vascular: Vessel Right Left  Radial Palpable Palpable  Brachial Palpable Palpable  Carotid Palpable, without bruit Palpable, without bruit  Aorta Not palpable N/A  Femoral Palpable Palpable  Popliteal Not palpable Not palpable  PT notPalpable notPalpable  DP Palpable Palpable   Gastrointestinal: soft, NTND  Musculoskeletal: M/S 5/5 throughout, Extremities without ischemic changes ,  Negative edema in legs, varicosities in R>L calf, spider veins in both legs, R arm edema 1+  Neurologic: CN 2-12 intact, Pain and light touch intact in extremities, Motor exam as listed above  Psychiatric: Judgment intact, Mood & affect appropriate for pt's clinical situation  Dermatologic: See M/S exam for extremity exam, no rashes otherwise noted, spider veins Bilateral LE  Lymph : No Cervical, Axillary, or Inguinal lymphadenopathy today on exam   Non-Invasive Vascular Imaging  BLE Venous Insufficiency Duplex (Date: 05/15/2016):   RLE:   Reflux common femoral only  LLE: no reflux  No DVT bilaterally   Medical Decision Making  Cynthia Farrell is a 70 y.o. female who presents with: right LE  venous insufficiency  with complications.     Active range of motion when up, water aerobic therapy, and elevation of the LE's when at rest.  Based on the patient's history and examination, I recommend: thigh high compression stockings  I discussed with the patient the use of her 20-30 mm thigh high compression stockings.  The  patient will follow up PRN  Thank you for allowing [place] to participate in this patient's care.   Theda Sers, Romon Devereux [facility] PA-C [facility]  The patient was seen in conjunction with Dr. Bridgett Larsson today   05/15/2016, 1:33 PM  Addendum  I have independently interviewed and examined the patient, and I agree with the physician assistant's findings.  Pt has chronic lymphedema in R arm related to mastectomy.  I am referring her to the [facility] clinic for lymphatic massage and compressive therapy.  In regards to her leg swelling, I suspect her venous insufficiency duplex is undercalling her disease.  Regardless, the treatment is compression anyway.  This patient has no evidence of lymphedema involving her legs.  Adele Barthel, MD [facility] [place] Office: [phone] Pager: [phone]  05/15/2016, 2:07 PM

## 2016-06-25 DIAGNOSIS — I10 Essential (primary) hypertension: Secondary | ICD-10-CM | POA: Diagnosis not present

## 2016-07-17 DIAGNOSIS — K7689 Other specified diseases of liver: Secondary | ICD-10-CM | POA: Diagnosis not present

## 2016-07-17 DIAGNOSIS — Z23 Encounter for immunization: Secondary | ICD-10-CM | POA: Diagnosis not present

## 2016-07-21 ENCOUNTER — Other Ambulatory Visit: Payer: Self-pay | Admitting: Internal Medicine

## 2016-07-21 DIAGNOSIS — R945 Abnormal results of liver function studies: Secondary | ICD-10-CM

## 2016-07-30 ENCOUNTER — Ambulatory Visit
Admission: RE | Admit: 2016-07-30 | Discharge: 2016-07-30 | Disposition: A | Discharge status: Home / Self Care | Payer: Medicare Other | Source: Ambulatory Visit | Attending: Internal Medicine | Admitting: Internal Medicine

## 2016-07-30 DIAGNOSIS — R945 Abnormal results of liver function studies: Secondary | ICD-10-CM

## 2016-07-30 DIAGNOSIS — R7989 Other specified abnormal findings of blood chemistry: Secondary | ICD-10-CM | POA: Diagnosis not present

## 2016-08-12 DIAGNOSIS — K7689 Other specified diseases of liver: Secondary | ICD-10-CM | POA: Diagnosis not present

## 2016-08-20 DIAGNOSIS — E7800 Pure hypercholesterolemia, unspecified: Secondary | ICD-10-CM | POA: Diagnosis not present

## 2016-08-20 DIAGNOSIS — M0609 Rheumatoid arthritis without rheumatoid factor, multiple sites: Secondary | ICD-10-CM | POA: Diagnosis not present

## 2016-08-20 DIAGNOSIS — K7689 Other specified diseases of liver: Secondary | ICD-10-CM | POA: Diagnosis not present

## 2016-08-20 DIAGNOSIS — I10 Essential (primary) hypertension: Secondary | ICD-10-CM | POA: Diagnosis not present

## 2016-08-31 DIAGNOSIS — H2512 Age-related nuclear cataract, left eye: Secondary | ICD-10-CM | POA: Diagnosis not present

## 2016-08-31 DIAGNOSIS — D2211 Melanocytic nevi of right eyelid, including canthus: Secondary | ICD-10-CM | POA: Diagnosis not present

## 2016-08-31 DIAGNOSIS — H25811 Combined forms of age-related cataract, right eye: Secondary | ICD-10-CM | POA: Diagnosis not present

## 2016-08-31 DIAGNOSIS — H43393 Other vitreous opacities, bilateral: Secondary | ICD-10-CM | POA: Diagnosis not present

## 2016-10-05 DIAGNOSIS — M25561 Pain in right knee: Secondary | ICD-10-CM | POA: Diagnosis not present

## 2016-10-05 DIAGNOSIS — Z79899 Other long term (current) drug therapy: Secondary | ICD-10-CM | POA: Diagnosis not present

## 2016-10-05 DIAGNOSIS — M1990 Unspecified osteoarthritis, unspecified site: Secondary | ICD-10-CM | POA: Diagnosis not present

## 2016-10-05 DIAGNOSIS — M0609 Rheumatoid arthritis without rheumatoid factor, multiple sites: Secondary | ICD-10-CM | POA: Diagnosis not present

## 2016-10-12 DIAGNOSIS — M0609 Rheumatoid arthritis without rheumatoid factor, multiple sites: Secondary | ICD-10-CM | POA: Diagnosis not present

## 2016-10-12 DIAGNOSIS — E559 Vitamin D deficiency, unspecified: Secondary | ICD-10-CM | POA: Diagnosis not present

## 2016-10-12 DIAGNOSIS — M1990 Unspecified osteoarthritis, unspecified site: Secondary | ICD-10-CM | POA: Diagnosis not present

## 2016-10-12 DIAGNOSIS — R748 Abnormal levels of other serum enzymes: Secondary | ICD-10-CM | POA: Diagnosis not present

## 2016-11-12 DIAGNOSIS — M0609 Rheumatoid arthritis without rheumatoid factor, multiple sites: Secondary | ICD-10-CM | POA: Diagnosis not present

## 2016-11-12 DIAGNOSIS — K7689 Other specified diseases of liver: Secondary | ICD-10-CM | POA: Diagnosis not present

## 2016-11-12 DIAGNOSIS — I10 Essential (primary) hypertension: Secondary | ICD-10-CM | POA: Diagnosis not present

## 2016-11-19 DIAGNOSIS — R05 Cough: Secondary | ICD-10-CM | POA: Diagnosis not present

## 2016-11-19 DIAGNOSIS — Z0000 Encounter for general adult medical examination without abnormal findings: Secondary | ICD-10-CM | POA: Diagnosis not present

## 2016-11-19 DIAGNOSIS — L821 Other seborrheic keratosis: Secondary | ICD-10-CM | POA: Diagnosis not present

## 2016-11-19 DIAGNOSIS — K7689 Other specified diseases of liver: Secondary | ICD-10-CM | POA: Diagnosis not present

## 2017-01-04 DIAGNOSIS — M1990 Unspecified osteoarthritis, unspecified site: Secondary | ICD-10-CM | POA: Diagnosis not present

## 2017-01-04 DIAGNOSIS — M25561 Pain in right knee: Secondary | ICD-10-CM | POA: Diagnosis not present

## 2017-01-04 DIAGNOSIS — Z79899 Other long term (current) drug therapy: Secondary | ICD-10-CM | POA: Diagnosis not present

## 2017-01-04 DIAGNOSIS — M0609 Rheumatoid arthritis without rheumatoid factor, multiple sites: Secondary | ICD-10-CM | POA: Diagnosis not present

## 2017-01-06 DIAGNOSIS — L820 Inflamed seborrheic keratosis: Secondary | ICD-10-CM | POA: Diagnosis not present

## 2017-01-06 DIAGNOSIS — L814 Other melanin hyperpigmentation: Secondary | ICD-10-CM | POA: Diagnosis not present

## 2017-01-06 DIAGNOSIS — L821 Other seborrheic keratosis: Secondary | ICD-10-CM | POA: Diagnosis not present

## 2017-02-09 DIAGNOSIS — M25562 Pain in left knee: Secondary | ICD-10-CM | POA: Diagnosis not present

## 2017-02-09 DIAGNOSIS — Z79899 Other long term (current) drug therapy: Secondary | ICD-10-CM | POA: Diagnosis not present

## 2017-02-09 DIAGNOSIS — M0609 Rheumatoid arthritis without rheumatoid factor, multiple sites: Secondary | ICD-10-CM | POA: Diagnosis not present

## 2017-02-09 DIAGNOSIS — R945 Abnormal results of liver function studies: Secondary | ICD-10-CM | POA: Diagnosis not present

## 2017-02-09 DIAGNOSIS — M1990 Unspecified osteoarthritis, unspecified site: Secondary | ICD-10-CM | POA: Diagnosis not present

## 2017-03-02 DIAGNOSIS — M0609 Rheumatoid arthritis without rheumatoid factor, multiple sites: Secondary | ICD-10-CM | POA: Diagnosis not present

## 2017-03-02 DIAGNOSIS — Z79899 Other long term (current) drug therapy: Secondary | ICD-10-CM | POA: Diagnosis not present

## 2017-03-11 DIAGNOSIS — R0609 Other forms of dyspnea: Secondary | ICD-10-CM | POA: Diagnosis not present

## 2017-03-11 DIAGNOSIS — Q248 Other specified congenital malformations of heart: Secondary | ICD-10-CM | POA: Diagnosis not present

## 2017-03-11 DIAGNOSIS — R55 Syncope and collapse: Secondary | ICD-10-CM | POA: Diagnosis not present

## 2017-03-11 DIAGNOSIS — E7800 Pure hypercholesterolemia, unspecified: Secondary | ICD-10-CM | POA: Diagnosis not present

## 2017-03-23 DIAGNOSIS — Q248 Other specified congenital malformations of heart: Secondary | ICD-10-CM | POA: Diagnosis not present

## 2017-04-06 DIAGNOSIS — M1990 Unspecified osteoarthritis, unspecified site: Secondary | ICD-10-CM | POA: Diagnosis not present

## 2017-04-06 DIAGNOSIS — R945 Abnormal results of liver function studies: Secondary | ICD-10-CM | POA: Diagnosis not present

## 2017-04-06 DIAGNOSIS — M0609 Rheumatoid arthritis without rheumatoid factor, multiple sites: Secondary | ICD-10-CM | POA: Diagnosis not present

## 2017-04-06 DIAGNOSIS — Z79899 Other long term (current) drug therapy: Secondary | ICD-10-CM | POA: Diagnosis not present

## 2017-04-13 DIAGNOSIS — Q248 Other specified congenital malformations of heart: Secondary | ICD-10-CM | POA: Diagnosis not present

## 2017-04-13 DIAGNOSIS — R55 Syncope and collapse: Secondary | ICD-10-CM | POA: Diagnosis not present

## 2017-04-13 DIAGNOSIS — R001 Bradycardia, unspecified: Secondary | ICD-10-CM | POA: Diagnosis not present

## 2017-04-13 DIAGNOSIS — R0609 Other forms of dyspnea: Secondary | ICD-10-CM | POA: Diagnosis not present

## 2017-05-17 DIAGNOSIS — D2211 Melanocytic nevi of right eyelid, including canthus: Secondary | ICD-10-CM | POA: Diagnosis not present

## 2017-05-17 DIAGNOSIS — H25811 Combined forms of age-related cataract, right eye: Secondary | ICD-10-CM | POA: Diagnosis not present

## 2017-05-17 DIAGNOSIS — H2512 Age-related nuclear cataract, left eye: Secondary | ICD-10-CM | POA: Diagnosis not present

## 2017-05-17 DIAGNOSIS — H43393 Other vitreous opacities, bilateral: Secondary | ICD-10-CM | POA: Diagnosis not present

## 2017-05-25 DIAGNOSIS — Z780 Asymptomatic menopausal state: Secondary | ICD-10-CM | POA: Diagnosis not present

## 2017-05-25 DIAGNOSIS — K7689 Other specified diseases of liver: Secondary | ICD-10-CM | POA: Diagnosis not present

## 2017-05-25 DIAGNOSIS — I10 Essential (primary) hypertension: Secondary | ICD-10-CM | POA: Diagnosis not present

## 2017-05-25 DIAGNOSIS — E559 Vitamin D deficiency, unspecified: Secondary | ICD-10-CM | POA: Diagnosis not present

## 2017-06-02 DIAGNOSIS — Z0000 Encounter for general adult medical examination without abnormal findings: Secondary | ICD-10-CM | POA: Diagnosis not present

## 2017-06-02 DIAGNOSIS — K7689 Other specified diseases of liver: Secondary | ICD-10-CM | POA: Diagnosis not present

## 2017-06-02 DIAGNOSIS — Z23 Encounter for immunization: Secondary | ICD-10-CM | POA: Diagnosis not present

## 2017-06-02 DIAGNOSIS — E7800 Pure hypercholesterolemia, unspecified: Secondary | ICD-10-CM | POA: Diagnosis not present

## 2017-07-26 DIAGNOSIS — M0609 Rheumatoid arthritis without rheumatoid factor, multiple sites: Secondary | ICD-10-CM | POA: Diagnosis not present

## 2017-07-26 DIAGNOSIS — M1990 Unspecified osteoarthritis, unspecified site: Secondary | ICD-10-CM | POA: Diagnosis not present

## 2017-07-26 DIAGNOSIS — Z79899 Other long term (current) drug therapy: Secondary | ICD-10-CM | POA: Diagnosis not present

## 2017-07-26 DIAGNOSIS — R945 Abnormal results of liver function studies: Secondary | ICD-10-CM | POA: Diagnosis not present

## 2017-08-04 DIAGNOSIS — H25811 Combined forms of age-related cataract, right eye: Secondary | ICD-10-CM | POA: Diagnosis not present

## 2017-08-04 DIAGNOSIS — H43811 Vitreous degeneration, right eye: Secondary | ICD-10-CM | POA: Diagnosis not present

## 2017-08-04 DIAGNOSIS — H43393 Other vitreous opacities, bilateral: Secondary | ICD-10-CM | POA: Diagnosis not present

## 2017-08-04 DIAGNOSIS — H2512 Age-related nuclear cataract, left eye: Secondary | ICD-10-CM | POA: Diagnosis not present

## 2017-08-19 DIAGNOSIS — M0609 Rheumatoid arthritis without rheumatoid factor, multiple sites: Secondary | ICD-10-CM | POA: Diagnosis not present

## 2017-08-19 DIAGNOSIS — M179 Osteoarthritis of knee, unspecified: Secondary | ICD-10-CM | POA: Diagnosis not present

## 2017-08-19 DIAGNOSIS — Z79899 Other long term (current) drug therapy: Secondary | ICD-10-CM | POA: Diagnosis not present

## 2017-08-19 DIAGNOSIS — M25561 Pain in right knee: Secondary | ICD-10-CM | POA: Diagnosis not present

## 2017-08-19 DIAGNOSIS — R945 Abnormal results of liver function studies: Secondary | ICD-10-CM | POA: Diagnosis not present

## 2017-08-19 DIAGNOSIS — M1990 Unspecified osteoarthritis, unspecified site: Secondary | ICD-10-CM | POA: Diagnosis not present

## 2017-08-19 DIAGNOSIS — M25562 Pain in left knee: Secondary | ICD-10-CM | POA: Diagnosis not present

## 2017-08-20 ENCOUNTER — Other Ambulatory Visit: Payer: Self-pay | Admitting: Rheumatology

## 2017-08-20 DIAGNOSIS — M25562 Pain in left knee: Secondary | ICD-10-CM

## 2017-08-25 DIAGNOSIS — M47812 Spondylosis without myelopathy or radiculopathy, cervical region: Secondary | ICD-10-CM | POA: Diagnosis not present

## 2017-08-25 DIAGNOSIS — M9901 Segmental and somatic dysfunction of cervical region: Secondary | ICD-10-CM | POA: Diagnosis not present

## 2017-08-25 DIAGNOSIS — M9903 Segmental and somatic dysfunction of lumbar region: Secondary | ICD-10-CM | POA: Diagnosis not present

## 2017-08-25 DIAGNOSIS — M9906 Segmental and somatic dysfunction of lower extremity: Secondary | ICD-10-CM | POA: Diagnosis not present

## 2017-08-26 DIAGNOSIS — M9906 Segmental and somatic dysfunction of lower extremity: Secondary | ICD-10-CM | POA: Diagnosis not present

## 2017-08-26 DIAGNOSIS — M9903 Segmental and somatic dysfunction of lumbar region: Secondary | ICD-10-CM | POA: Diagnosis not present

## 2017-08-26 DIAGNOSIS — M47812 Spondylosis without myelopathy or radiculopathy, cervical region: Secondary | ICD-10-CM | POA: Diagnosis not present

## 2017-08-26 DIAGNOSIS — M9901 Segmental and somatic dysfunction of cervical region: Secondary | ICD-10-CM | POA: Diagnosis not present

## 2017-08-30 ENCOUNTER — Other Ambulatory Visit: Payer: Medicare Other

## 2017-09-01 DIAGNOSIS — E7800 Pure hypercholesterolemia, unspecified: Secondary | ICD-10-CM | POA: Diagnosis not present

## 2017-09-01 DIAGNOSIS — M9901 Segmental and somatic dysfunction of cervical region: Secondary | ICD-10-CM | POA: Diagnosis not present

## 2017-09-01 DIAGNOSIS — M9906 Segmental and somatic dysfunction of lower extremity: Secondary | ICD-10-CM | POA: Diagnosis not present

## 2017-09-01 DIAGNOSIS — M47812 Spondylosis without myelopathy or radiculopathy, cervical region: Secondary | ICD-10-CM | POA: Diagnosis not present

## 2017-09-01 DIAGNOSIS — M9903 Segmental and somatic dysfunction of lumbar region: Secondary | ICD-10-CM | POA: Diagnosis not present

## 2017-09-02 DIAGNOSIS — M9903 Segmental and somatic dysfunction of lumbar region: Secondary | ICD-10-CM | POA: Diagnosis not present

## 2017-09-02 DIAGNOSIS — M9901 Segmental and somatic dysfunction of cervical region: Secondary | ICD-10-CM | POA: Diagnosis not present

## 2017-09-02 DIAGNOSIS — M9906 Segmental and somatic dysfunction of lower extremity: Secondary | ICD-10-CM | POA: Diagnosis not present

## 2017-09-02 DIAGNOSIS — M47812 Spondylosis without myelopathy or radiculopathy, cervical region: Secondary | ICD-10-CM | POA: Diagnosis not present

## 2017-09-06 ENCOUNTER — Ambulatory Visit
Admission: RE | Admit: 2017-09-06 | Discharge: 2017-09-06 | Disposition: A | Discharge status: Home / Self Care | Payer: Medicare Other | Source: Ambulatory Visit | Attending: Rheumatology | Admitting: Rheumatology

## 2017-09-06 DIAGNOSIS — M9903 Segmental and somatic dysfunction of lumbar region: Secondary | ICD-10-CM | POA: Diagnosis not present

## 2017-09-06 DIAGNOSIS — M47812 Spondylosis without myelopathy or radiculopathy, cervical region: Secondary | ICD-10-CM | POA: Diagnosis not present

## 2017-09-06 DIAGNOSIS — M9901 Segmental and somatic dysfunction of cervical region: Secondary | ICD-10-CM | POA: Diagnosis not present

## 2017-09-06 DIAGNOSIS — M25562 Pain in left knee: Secondary | ICD-10-CM

## 2017-09-06 DIAGNOSIS — M9906 Segmental and somatic dysfunction of lower extremity: Secondary | ICD-10-CM | POA: Diagnosis not present

## 2017-09-06 DIAGNOSIS — S83207A Unspecified tear of unspecified meniscus, current injury, left knee, initial encounter: Secondary | ICD-10-CM | POA: Diagnosis not present

## 2017-09-06 MED ORDER — GADOBENATE DIMEGLUMINE 529 MG/ML IV SOLN
18.0000 mL | Freq: Once | INTRAVENOUS | Status: AC | PRN
  Administered 2017-09-06: 18 mL via INTRAVENOUS

## 2017-09-07 DIAGNOSIS — M47812 Spondylosis without myelopathy or radiculopathy, cervical region: Secondary | ICD-10-CM | POA: Diagnosis not present

## 2017-09-07 DIAGNOSIS — M25562 Pain in left knee: Secondary | ICD-10-CM | POA: Diagnosis not present

## 2017-09-07 DIAGNOSIS — M9903 Segmental and somatic dysfunction of lumbar region: Secondary | ICD-10-CM | POA: Diagnosis not present

## 2017-09-07 DIAGNOSIS — M9906 Segmental and somatic dysfunction of lower extremity: Secondary | ICD-10-CM | POA: Diagnosis not present

## 2017-09-07 DIAGNOSIS — G8929 Other chronic pain: Secondary | ICD-10-CM | POA: Diagnosis not present

## 2017-09-07 DIAGNOSIS — E7800 Pure hypercholesterolemia, unspecified: Secondary | ICD-10-CM | POA: Diagnosis not present

## 2017-09-07 DIAGNOSIS — M9901 Segmental and somatic dysfunction of cervical region: Secondary | ICD-10-CM | POA: Diagnosis not present

## 2017-09-07 DIAGNOSIS — E559 Vitamin D deficiency, unspecified: Secondary | ICD-10-CM | POA: Diagnosis not present

## 2017-09-08 DIAGNOSIS — M9906 Segmental and somatic dysfunction of lower extremity: Secondary | ICD-10-CM | POA: Diagnosis not present

## 2017-09-08 DIAGNOSIS — M9903 Segmental and somatic dysfunction of lumbar region: Secondary | ICD-10-CM | POA: Diagnosis not present

## 2017-09-08 DIAGNOSIS — M9901 Segmental and somatic dysfunction of cervical region: Secondary | ICD-10-CM | POA: Diagnosis not present

## 2017-09-08 DIAGNOSIS — M47812 Spondylosis without myelopathy or radiculopathy, cervical region: Secondary | ICD-10-CM | POA: Diagnosis not present

## 2017-09-09 DIAGNOSIS — M9903 Segmental and somatic dysfunction of lumbar region: Secondary | ICD-10-CM | POA: Diagnosis not present

## 2017-09-09 DIAGNOSIS — M9906 Segmental and somatic dysfunction of lower extremity: Secondary | ICD-10-CM | POA: Diagnosis not present

## 2017-09-09 DIAGNOSIS — M47812 Spondylosis without myelopathy or radiculopathy, cervical region: Secondary | ICD-10-CM | POA: Diagnosis not present

## 2017-09-09 DIAGNOSIS — M9901 Segmental and somatic dysfunction of cervical region: Secondary | ICD-10-CM | POA: Diagnosis not present

## 2017-09-10 ENCOUNTER — Other Ambulatory Visit: Payer: Self-pay | Admitting: Internal Medicine

## 2017-09-10 DIAGNOSIS — Z1231 Encounter for screening mammogram for malignant neoplasm of breast: Secondary | ICD-10-CM

## 2017-09-28 DIAGNOSIS — M9901 Segmental and somatic dysfunction of cervical region: Secondary | ICD-10-CM | POA: Diagnosis not present

## 2017-09-28 DIAGNOSIS — M47812 Spondylosis without myelopathy or radiculopathy, cervical region: Secondary | ICD-10-CM | POA: Diagnosis not present

## 2017-09-28 DIAGNOSIS — M9903 Segmental and somatic dysfunction of lumbar region: Secondary | ICD-10-CM | POA: Diagnosis not present

## 2017-09-28 DIAGNOSIS — M9906 Segmental and somatic dysfunction of lower extremity: Secondary | ICD-10-CM | POA: Diagnosis not present

## 2017-09-30 DIAGNOSIS — M47812 Spondylosis without myelopathy or radiculopathy, cervical region: Secondary | ICD-10-CM | POA: Diagnosis not present

## 2017-09-30 DIAGNOSIS — M9903 Segmental and somatic dysfunction of lumbar region: Secondary | ICD-10-CM | POA: Diagnosis not present

## 2017-09-30 DIAGNOSIS — M9906 Segmental and somatic dysfunction of lower extremity: Secondary | ICD-10-CM | POA: Diagnosis not present

## 2017-09-30 DIAGNOSIS — M9901 Segmental and somatic dysfunction of cervical region: Secondary | ICD-10-CM | POA: Diagnosis not present

## 2017-10-04 DIAGNOSIS — M9903 Segmental and somatic dysfunction of lumbar region: Secondary | ICD-10-CM | POA: Diagnosis not present

## 2017-10-04 DIAGNOSIS — M47812 Spondylosis without myelopathy or radiculopathy, cervical region: Secondary | ICD-10-CM | POA: Diagnosis not present

## 2017-10-04 DIAGNOSIS — M9906 Segmental and somatic dysfunction of lower extremity: Secondary | ICD-10-CM | POA: Diagnosis not present

## 2017-10-04 DIAGNOSIS — M9901 Segmental and somatic dysfunction of cervical region: Secondary | ICD-10-CM | POA: Diagnosis not present

## 2017-10-05 ENCOUNTER — Other Ambulatory Visit: Payer: Self-pay | Admitting: Physical Medicine and Rehabilitation

## 2017-10-05 DIAGNOSIS — M47816 Spondylosis without myelopathy or radiculopathy, lumbar region: Secondary | ICD-10-CM | POA: Diagnosis not present

## 2017-10-05 DIAGNOSIS — M5136 Other intervertebral disc degeneration, lumbar region: Secondary | ICD-10-CM

## 2017-10-05 DIAGNOSIS — M545 Low back pain: Secondary | ICD-10-CM

## 2017-10-07 DIAGNOSIS — M9903 Segmental and somatic dysfunction of lumbar region: Secondary | ICD-10-CM | POA: Diagnosis not present

## 2017-10-07 DIAGNOSIS — M9901 Segmental and somatic dysfunction of cervical region: Secondary | ICD-10-CM | POA: Diagnosis not present

## 2017-10-07 DIAGNOSIS — M47812 Spondylosis without myelopathy or radiculopathy, cervical region: Secondary | ICD-10-CM | POA: Diagnosis not present

## 2017-10-07 DIAGNOSIS — M9906 Segmental and somatic dysfunction of lower extremity: Secondary | ICD-10-CM | POA: Diagnosis not present

## 2017-10-09 ENCOUNTER — Ambulatory Visit
Admission: RE | Admit: 2017-10-09 | Discharge: 2017-10-09 | Disposition: A | Discharge status: Home / Self Care | Payer: Medicare Other | Source: Ambulatory Visit | Attending: Physical Medicine and Rehabilitation | Admitting: Physical Medicine and Rehabilitation

## 2017-10-09 DIAGNOSIS — M545 Low back pain: Secondary | ICD-10-CM

## 2017-10-09 DIAGNOSIS — M48061 Spinal stenosis, lumbar region without neurogenic claudication: Secondary | ICD-10-CM | POA: Diagnosis not present

## 2017-10-09 DIAGNOSIS — M5136 Other intervertebral disc degeneration, lumbar region: Secondary | ICD-10-CM

## 2017-10-12 DIAGNOSIS — M47812 Spondylosis without myelopathy or radiculopathy, cervical region: Secondary | ICD-10-CM | POA: Diagnosis not present

## 2017-10-12 DIAGNOSIS — M9901 Segmental and somatic dysfunction of cervical region: Secondary | ICD-10-CM | POA: Diagnosis not present

## 2017-10-12 DIAGNOSIS — M9906 Segmental and somatic dysfunction of lower extremity: Secondary | ICD-10-CM | POA: Diagnosis not present

## 2017-10-12 DIAGNOSIS — M9903 Segmental and somatic dysfunction of lumbar region: Secondary | ICD-10-CM | POA: Diagnosis not present

## 2017-10-26 DIAGNOSIS — M1990 Unspecified osteoarthritis, unspecified site: Secondary | ICD-10-CM | POA: Diagnosis not present

## 2017-10-26 DIAGNOSIS — M0609 Rheumatoid arthritis without rheumatoid factor, multiple sites: Secondary | ICD-10-CM | POA: Diagnosis not present

## 2017-10-26 DIAGNOSIS — Z79899 Other long term (current) drug therapy: Secondary | ICD-10-CM | POA: Diagnosis not present

## 2017-10-26 DIAGNOSIS — R945 Abnormal results of liver function studies: Secondary | ICD-10-CM | POA: Diagnosis not present

## 2017-11-04 DIAGNOSIS — M47816 Spondylosis without myelopathy or radiculopathy, lumbar region: Secondary | ICD-10-CM | POA: Diagnosis not present

## 2017-11-04 DIAGNOSIS — M545 Low back pain: Secondary | ICD-10-CM | POA: Diagnosis not present

## 2017-12-16 DIAGNOSIS — M25562 Pain in left knee: Secondary | ICD-10-CM | POA: Diagnosis not present

## 2017-12-29 DIAGNOSIS — M545 Low back pain: Secondary | ICD-10-CM | POA: Diagnosis not present

## 2017-12-29 DIAGNOSIS — M47816 Spondylosis without myelopathy or radiculopathy, lumbar region: Secondary | ICD-10-CM | POA: Diagnosis not present

## 2018-01-25 DIAGNOSIS — M47816 Spondylosis without myelopathy or radiculopathy, lumbar region: Secondary | ICD-10-CM | POA: Diagnosis not present

## 2018-01-25 DIAGNOSIS — M545 Low back pain: Secondary | ICD-10-CM | POA: Diagnosis not present

## 2018-02-03 DIAGNOSIS — Z79899 Other long term (current) drug therapy: Secondary | ICD-10-CM | POA: Diagnosis not present

## 2018-02-03 DIAGNOSIS — R945 Abnormal results of liver function studies: Secondary | ICD-10-CM | POA: Diagnosis not present

## 2018-02-03 DIAGNOSIS — M1990 Unspecified osteoarthritis, unspecified site: Secondary | ICD-10-CM | POA: Diagnosis not present

## 2018-02-03 DIAGNOSIS — M0609 Rheumatoid arthritis without rheumatoid factor, multiple sites: Secondary | ICD-10-CM | POA: Diagnosis not present

## 2018-02-17 DIAGNOSIS — R21 Rash and other nonspecific skin eruption: Secondary | ICD-10-CM | POA: Diagnosis not present

## 2018-02-17 DIAGNOSIS — M797 Fibromyalgia: Secondary | ICD-10-CM | POA: Diagnosis not present

## 2018-02-17 DIAGNOSIS — E559 Vitamin D deficiency, unspecified: Secondary | ICD-10-CM | POA: Diagnosis not present

## 2018-02-17 DIAGNOSIS — I10 Essential (primary) hypertension: Secondary | ICD-10-CM | POA: Diagnosis not present

## 2018-02-21 DIAGNOSIS — H43811 Vitreous degeneration, right eye: Secondary | ICD-10-CM | POA: Diagnosis not present

## 2018-02-21 DIAGNOSIS — H2512 Age-related nuclear cataract, left eye: Secondary | ICD-10-CM | POA: Diagnosis not present

## 2018-02-21 DIAGNOSIS — H43393 Other vitreous opacities, bilateral: Secondary | ICD-10-CM | POA: Diagnosis not present

## 2018-02-21 DIAGNOSIS — H25811 Combined forms of age-related cataract, right eye: Secondary | ICD-10-CM | POA: Diagnosis not present

## 2018-03-08 DIAGNOSIS — E7800 Pure hypercholesterolemia, unspecified: Secondary | ICD-10-CM | POA: Diagnosis not present

## 2018-03-08 DIAGNOSIS — I10 Essential (primary) hypertension: Secondary | ICD-10-CM | POA: Diagnosis not present

## 2018-03-15 ENCOUNTER — Other Ambulatory Visit: Payer: Self-pay | Admitting: Internal Medicine

## 2018-03-15 DIAGNOSIS — N6320 Unspecified lump in the left breast, unspecified quadrant: Secondary | ICD-10-CM

## 2018-03-15 DIAGNOSIS — M549 Dorsalgia, unspecified: Secondary | ICD-10-CM | POA: Diagnosis not present

## 2018-03-15 DIAGNOSIS — I10 Essential (primary) hypertension: Secondary | ICD-10-CM | POA: Diagnosis not present

## 2018-03-15 DIAGNOSIS — E559 Vitamin D deficiency, unspecified: Secondary | ICD-10-CM | POA: Diagnosis not present

## 2018-03-15 DIAGNOSIS — E7800 Pure hypercholesterolemia, unspecified: Secondary | ICD-10-CM | POA: Diagnosis not present

## 2018-03-18 ENCOUNTER — Ambulatory Visit
Admission: RE | Admit: 2018-03-18 | Discharge: 2018-03-18 | Disposition: A | Discharge status: Home / Self Care | Payer: Medicare Other | Source: Ambulatory Visit | Attending: Internal Medicine | Admitting: Internal Medicine

## 2018-03-18 DIAGNOSIS — Z853 Personal history of malignant neoplasm of breast: Secondary | ICD-10-CM | POA: Diagnosis not present

## 2018-03-18 DIAGNOSIS — N6320 Unspecified lump in the left breast, unspecified quadrant: Secondary | ICD-10-CM

## 2018-03-18 DIAGNOSIS — R928 Other abnormal and inconclusive findings on diagnostic imaging of breast: Secondary | ICD-10-CM | POA: Diagnosis not present

## 2018-04-25 DIAGNOSIS — Z79899 Other long term (current) drug therapy: Secondary | ICD-10-CM | POA: Diagnosis not present

## 2018-04-25 DIAGNOSIS — M0609 Rheumatoid arthritis without rheumatoid factor, multiple sites: Secondary | ICD-10-CM | POA: Diagnosis not present

## 2018-04-28 DIAGNOSIS — M0609 Rheumatoid arthritis without rheumatoid factor, multiple sites: Secondary | ICD-10-CM | POA: Diagnosis not present

## 2018-04-28 DIAGNOSIS — M79642 Pain in left hand: Secondary | ICD-10-CM | POA: Diagnosis not present

## 2018-04-28 DIAGNOSIS — M79641 Pain in right hand: Secondary | ICD-10-CM | POA: Diagnosis not present

## 2018-04-28 DIAGNOSIS — Z79899 Other long term (current) drug therapy: Secondary | ICD-10-CM | POA: Diagnosis not present

## 2018-04-28 DIAGNOSIS — M19042 Primary osteoarthritis, left hand: Secondary | ICD-10-CM | POA: Diagnosis not present

## 2018-04-28 DIAGNOSIS — M19041 Primary osteoarthritis, right hand: Secondary | ICD-10-CM | POA: Diagnosis not present

## 2018-04-28 DIAGNOSIS — M1990 Unspecified osteoarthritis, unspecified site: Secondary | ICD-10-CM | POA: Diagnosis not present

## 2018-04-28 DIAGNOSIS — M79643 Pain in unspecified hand: Secondary | ICD-10-CM | POA: Diagnosis not present

## 2018-05-16 DIAGNOSIS — R55 Syncope and collapse: Secondary | ICD-10-CM | POA: Diagnosis not present

## 2018-05-16 DIAGNOSIS — R0609 Other forms of dyspnea: Secondary | ICD-10-CM | POA: Diagnosis not present

## 2018-05-16 DIAGNOSIS — Q248 Other specified congenital malformations of heart: Secondary | ICD-10-CM | POA: Diagnosis not present

## 2018-05-16 DIAGNOSIS — R001 Bradycardia, unspecified: Secondary | ICD-10-CM | POA: Diagnosis not present

## 2018-06-13 DIAGNOSIS — Q248 Other specified congenital malformations of heart: Secondary | ICD-10-CM | POA: Diagnosis not present

## 2018-06-13 DIAGNOSIS — R0989 Other specified symptoms and signs involving the circulatory and respiratory systems: Secondary | ICD-10-CM | POA: Diagnosis not present

## 2018-06-13 DIAGNOSIS — R55 Syncope and collapse: Secondary | ICD-10-CM | POA: Diagnosis not present

## 2018-07-27 DIAGNOSIS — R55 Syncope and collapse: Secondary | ICD-10-CM | POA: Diagnosis not present

## 2018-07-27 DIAGNOSIS — R0989 Other specified symptoms and signs involving the circulatory and respiratory systems: Secondary | ICD-10-CM | POA: Diagnosis not present

## 2018-07-27 DIAGNOSIS — R001 Bradycardia, unspecified: Secondary | ICD-10-CM | POA: Diagnosis not present

## 2018-07-27 DIAGNOSIS — R0609 Other forms of dyspnea: Secondary | ICD-10-CM | POA: Diagnosis not present

## 2018-07-28 DIAGNOSIS — M0609 Rheumatoid arthritis without rheumatoid factor, multiple sites: Secondary | ICD-10-CM | POA: Diagnosis not present

## 2018-07-28 DIAGNOSIS — M79643 Pain in unspecified hand: Secondary | ICD-10-CM | POA: Diagnosis not present

## 2018-07-28 DIAGNOSIS — Z79899 Other long term (current) drug therapy: Secondary | ICD-10-CM | POA: Diagnosis not present

## 2018-07-28 DIAGNOSIS — M1990 Unspecified osteoarthritis, unspecified site: Secondary | ICD-10-CM | POA: Diagnosis not present

## 2018-08-15 DIAGNOSIS — H25811 Combined forms of age-related cataract, right eye: Secondary | ICD-10-CM | POA: Diagnosis not present

## 2018-08-15 DIAGNOSIS — H2512 Age-related nuclear cataract, left eye: Secondary | ICD-10-CM | POA: Diagnosis not present

## 2018-08-15 DIAGNOSIS — H00025 Hordeolum internum left lower eyelid: Secondary | ICD-10-CM | POA: Diagnosis not present

## 2018-08-15 DIAGNOSIS — D22111 Melanocytic nevi of right upper eyelid, including canthus: Secondary | ICD-10-CM | POA: Diagnosis not present

## 2018-08-23 DIAGNOSIS — M25572 Pain in left ankle and joints of left foot: Secondary | ICD-10-CM | POA: Diagnosis not present

## 2018-08-23 DIAGNOSIS — M7989 Other specified soft tissue disorders: Secondary | ICD-10-CM | POA: Diagnosis not present

## 2018-08-23 DIAGNOSIS — M79672 Pain in left foot: Secondary | ICD-10-CM | POA: Diagnosis not present

## 2018-08-23 DIAGNOSIS — S99911A Unspecified injury of right ankle, initial encounter: Secondary | ICD-10-CM | POA: Diagnosis not present

## 2018-08-23 DIAGNOSIS — M25472 Effusion, left ankle: Secondary | ICD-10-CM | POA: Diagnosis not present

## 2018-08-23 DIAGNOSIS — M19072 Primary osteoarthritis, left ankle and foot: Secondary | ICD-10-CM | POA: Diagnosis not present

## 2018-08-23 DIAGNOSIS — M0609 Rheumatoid arthritis without rheumatoid factor, multiple sites: Secondary | ICD-10-CM | POA: Diagnosis not present

## 2018-08-25 DIAGNOSIS — R55 Syncope and collapse: Secondary | ICD-10-CM | POA: Diagnosis not present

## 2018-08-31 DIAGNOSIS — M76822 Posterior tibial tendinitis, left leg: Secondary | ICD-10-CM | POA: Diagnosis not present

## 2018-09-08 DIAGNOSIS — R55 Syncope and collapse: Secondary | ICD-10-CM | POA: Diagnosis not present

## 2018-09-08 DIAGNOSIS — R0609 Other forms of dyspnea: Secondary | ICD-10-CM | POA: Diagnosis not present

## 2018-09-08 DIAGNOSIS — R001 Bradycardia, unspecified: Secondary | ICD-10-CM | POA: Diagnosis not present

## 2018-09-12 DIAGNOSIS — I10 Essential (primary) hypertension: Secondary | ICD-10-CM | POA: Diagnosis not present

## 2018-09-12 DIAGNOSIS — E559 Vitamin D deficiency, unspecified: Secondary | ICD-10-CM | POA: Diagnosis not present

## 2018-09-19 DIAGNOSIS — E7800 Pure hypercholesterolemia, unspecified: Secondary | ICD-10-CM | POA: Diagnosis not present

## 2018-09-19 DIAGNOSIS — M797 Fibromyalgia: Secondary | ICD-10-CM | POA: Diagnosis not present

## 2018-09-19 DIAGNOSIS — I10 Essential (primary) hypertension: Secondary | ICD-10-CM | POA: Diagnosis not present

## 2018-09-19 DIAGNOSIS — Z0000 Encounter for general adult medical examination without abnormal findings: Secondary | ICD-10-CM | POA: Diagnosis not present

## 2018-09-22 DIAGNOSIS — Z5181 Encounter for therapeutic drug level monitoring: Secondary | ICD-10-CM | POA: Diagnosis not present

## 2018-09-27 DIAGNOSIS — L820 Inflamed seborrheic keratosis: Secondary | ICD-10-CM | POA: Diagnosis not present

## 2018-09-27 DIAGNOSIS — L821 Other seborrheic keratosis: Secondary | ICD-10-CM | POA: Diagnosis not present

## 2018-09-28 DIAGNOSIS — R5383 Other fatigue: Secondary | ICD-10-CM | POA: Diagnosis not present

## 2018-09-28 DIAGNOSIS — M810 Age-related osteoporosis without current pathological fracture: Secondary | ICD-10-CM | POA: Diagnosis not present

## 2018-09-28 DIAGNOSIS — E559 Vitamin D deficiency, unspecified: Secondary | ICD-10-CM | POA: Diagnosis not present

## 2018-09-28 DIAGNOSIS — M76822 Posterior tibial tendinitis, left leg: Secondary | ICD-10-CM | POA: Diagnosis not present

## 2018-09-29 ENCOUNTER — Ambulatory Visit: Payer: Medicare Other | Admitting: Podiatry

## 2018-10-19 DIAGNOSIS — S93492A Sprain of other ligament of left ankle, initial encounter: Secondary | ICD-10-CM | POA: Diagnosis not present

## 2018-10-24 DIAGNOSIS — M6281 Muscle weakness (generalized): Secondary | ICD-10-CM | POA: Diagnosis not present

## 2018-10-24 DIAGNOSIS — M84375D Stress fracture, left foot, subsequent encounter for fracture with routine healing: Secondary | ICD-10-CM | POA: Diagnosis not present

## 2018-10-24 DIAGNOSIS — M25672 Stiffness of left ankle, not elsewhere classified: Secondary | ICD-10-CM | POA: Diagnosis not present

## 2018-10-24 DIAGNOSIS — M76822 Posterior tibial tendinitis, left leg: Secondary | ICD-10-CM | POA: Diagnosis not present

## 2018-10-27 DIAGNOSIS — M0609 Rheumatoid arthritis without rheumatoid factor, multiple sites: Secondary | ICD-10-CM | POA: Diagnosis not present

## 2018-10-27 DIAGNOSIS — M79643 Pain in unspecified hand: Secondary | ICD-10-CM | POA: Diagnosis not present

## 2018-10-27 DIAGNOSIS — M1990 Unspecified osteoarthritis, unspecified site: Secondary | ICD-10-CM | POA: Diagnosis not present

## 2018-10-27 DIAGNOSIS — Z79899 Other long term (current) drug therapy: Secondary | ICD-10-CM | POA: Diagnosis not present

## 2018-10-31 DIAGNOSIS — M25672 Stiffness of left ankle, not elsewhere classified: Secondary | ICD-10-CM | POA: Diagnosis not present

## 2018-10-31 DIAGNOSIS — M76822 Posterior tibial tendinitis, left leg: Secondary | ICD-10-CM | POA: Diagnosis not present

## 2018-10-31 DIAGNOSIS — M84375D Stress fracture, left foot, subsequent encounter for fracture with routine healing: Secondary | ICD-10-CM | POA: Diagnosis not present

## 2018-10-31 DIAGNOSIS — M6281 Muscle weakness (generalized): Secondary | ICD-10-CM | POA: Diagnosis not present

## 2018-11-03 DIAGNOSIS — M84375D Stress fracture, left foot, subsequent encounter for fracture with routine healing: Secondary | ICD-10-CM | POA: Diagnosis not present

## 2018-11-03 DIAGNOSIS — M6281 Muscle weakness (generalized): Secondary | ICD-10-CM | POA: Diagnosis not present

## 2018-11-03 DIAGNOSIS — R269 Unspecified abnormalities of gait and mobility: Secondary | ICD-10-CM | POA: Diagnosis not present

## 2018-11-03 DIAGNOSIS — M76822 Posterior tibial tendinitis, left leg: Secondary | ICD-10-CM | POA: Diagnosis not present

## 2018-11-05 DIAGNOSIS — S92505A Nondisplaced unspecified fracture of left lesser toe(s), initial encounter for closed fracture: Secondary | ICD-10-CM | POA: Diagnosis not present

## 2018-11-14 DIAGNOSIS — E7800 Pure hypercholesterolemia, unspecified: Secondary | ICD-10-CM | POA: Diagnosis not present

## 2018-11-14 DIAGNOSIS — I10 Essential (primary) hypertension: Secondary | ICD-10-CM | POA: Diagnosis not present

## 2018-11-29 DIAGNOSIS — M0609 Rheumatoid arthritis without rheumatoid factor, multiple sites: Secondary | ICD-10-CM | POA: Diagnosis not present

## 2018-11-29 DIAGNOSIS — E785 Hyperlipidemia, unspecified: Secondary | ICD-10-CM | POA: Diagnosis not present

## 2018-11-29 DIAGNOSIS — E559 Vitamin D deficiency, unspecified: Secondary | ICD-10-CM | POA: Diagnosis not present

## 2018-12-16 ENCOUNTER — Other Ambulatory Visit: Payer: Self-pay

## 2018-12-16 MED ORDER — DILTIAZEM HCL ER 90 MG PO CP12: 90.0000 mg | ORAL_CAPSULE | Freq: Two times a day (BID) | ORAL | 1 refills | Status: DC

## 2018-12-24 IMAGING — MR MR LUMBAR SPINE W/O CM
4 series · 24 of 48 positions shown · non-contrast
Comparison: Lumbar radiographs 02/04/2006.  Abdomen MRI 07/27/2005.

CLINICAL DATA: 71-year-old female with severe central lumbar back
pain at the waistline radiating bilaterally. Left hip pain. Symptoms
for several months, beginning after heavy yard work in [REDACTED].

EXAM:
MRI LUMBAR SPINE WITHOUT CONTRAST
TECHNIQUE: Multiplanar, multisequence MR imaging of the lumbar spine was
performed. No intravenous contrast was administered.

[Series 6: T2 · sagittal · 4.0mm · 0.73mm/px · 8 of 15 slices shown]
[im 1/15]
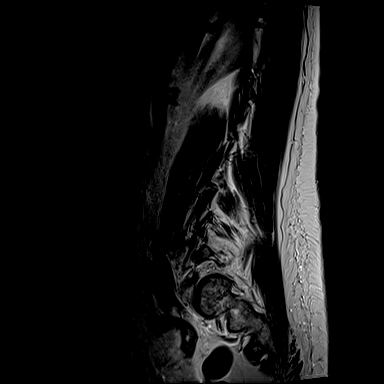
[im 3/15]
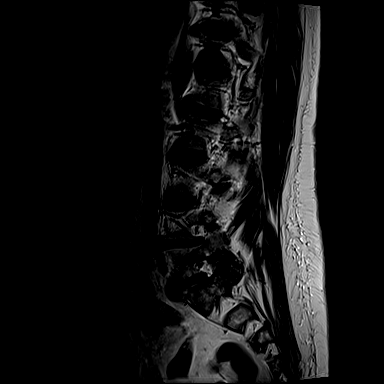
[im 5/15]
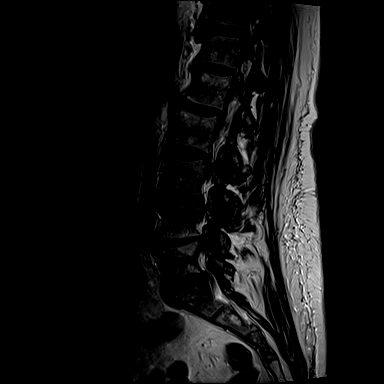
[im 7/15]
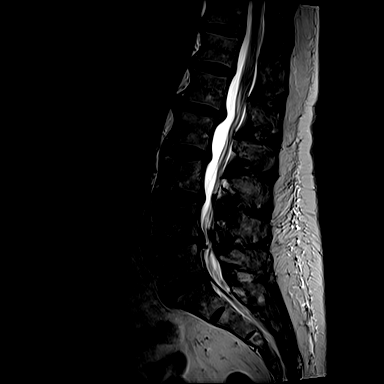
[im 9/15]
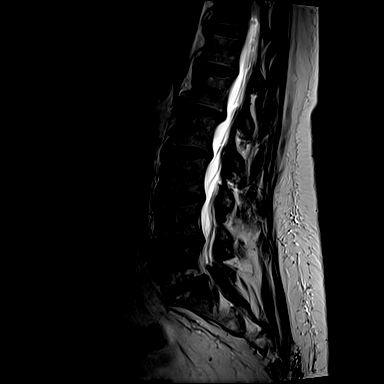
[im 11/15]
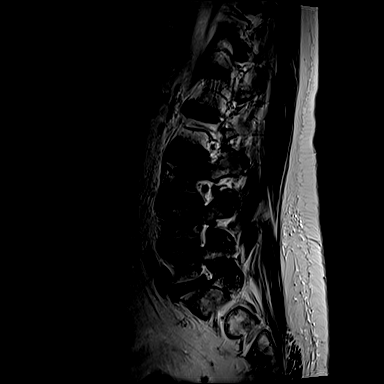
[im 13/15]
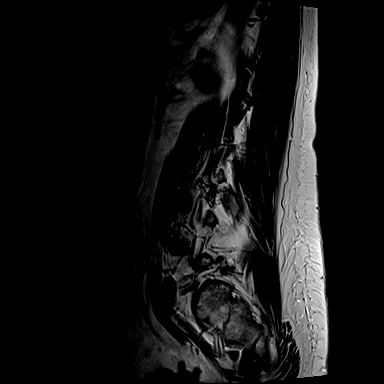
[im 15/15]
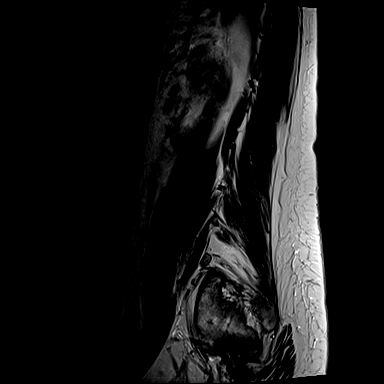

[Series 7: T1 · sagittal · 4.0mm · 0.73mm/px · 9 of 15 slices shown (1 of 2)]
[im 1/15]
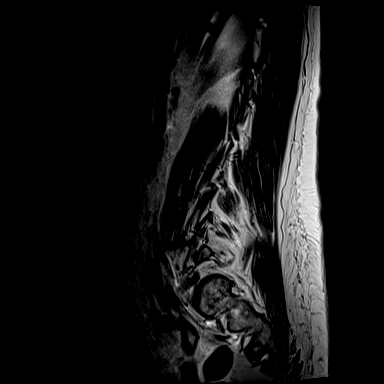
[im 2/15]
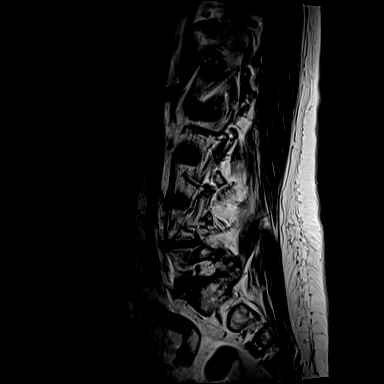
[im 4/15]
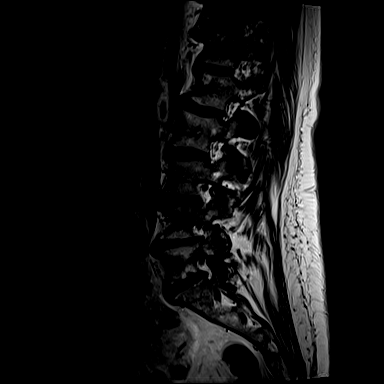
[im 6/15]
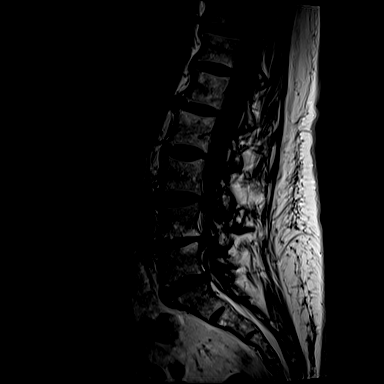
[im 8/15]
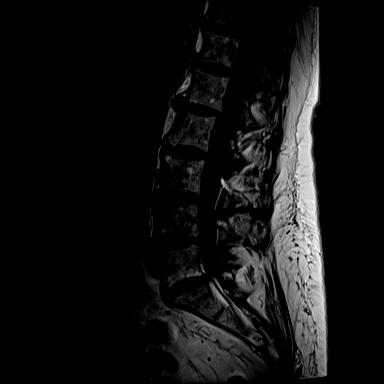
[im 9/15]
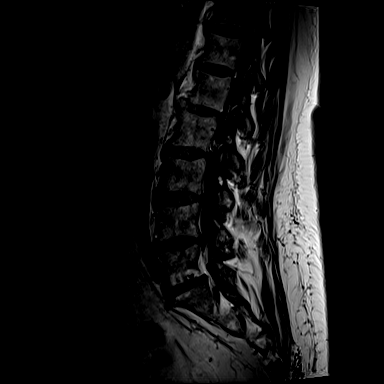
[im 11/15]
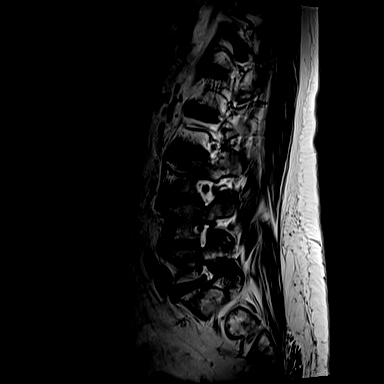
[im 13/15]
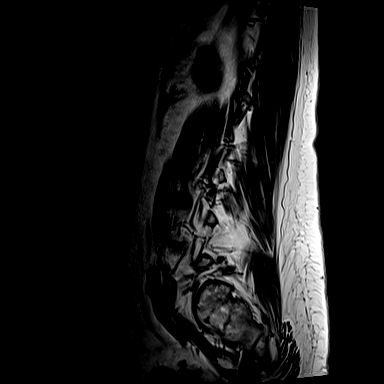
[im 15/15]
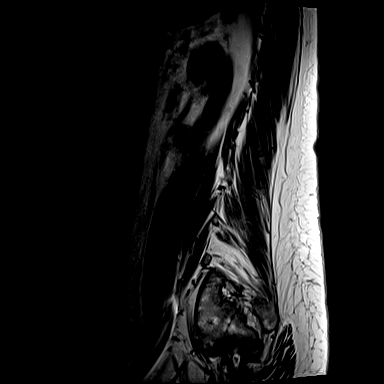

[Series 8: STIR · sagittal · 4.0mm · 0.88mm/px · 3 of 15 slices shown]
[im 2/15]
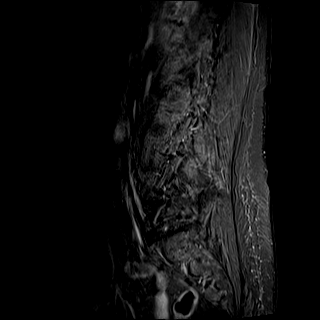
[im 8/15]
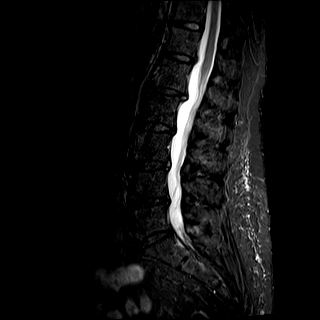
[im 13/15]
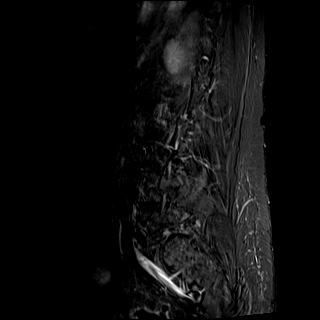

[Series 11: T1 · axial · 4.0mm · 0.28mm/px · z∈[-10,+162]mm · 4 of 39 slices shown (2 of 2)]
[im 2/39]
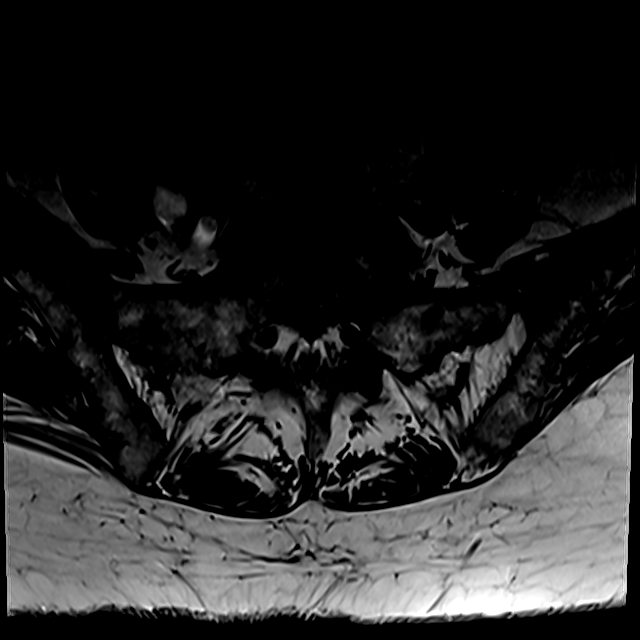
[im 6/39]
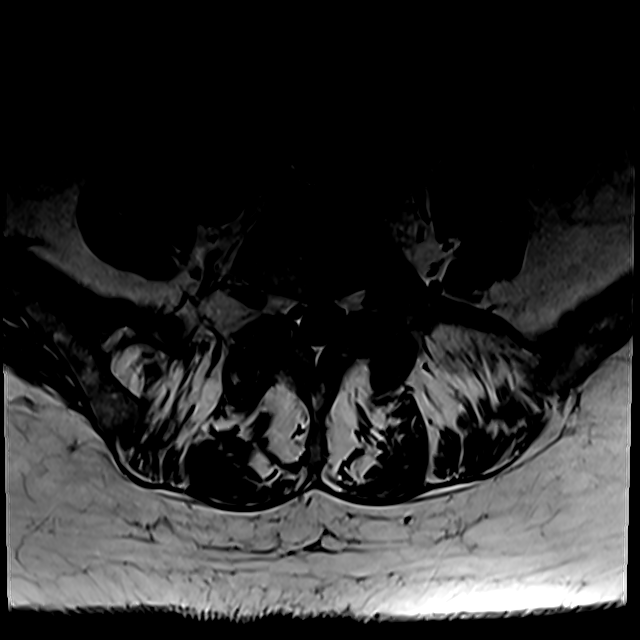
[im 20/39]
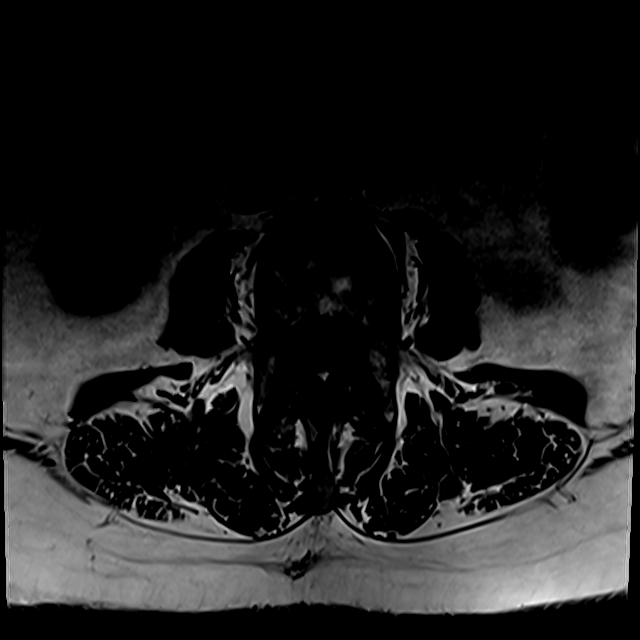
[im 33/39]
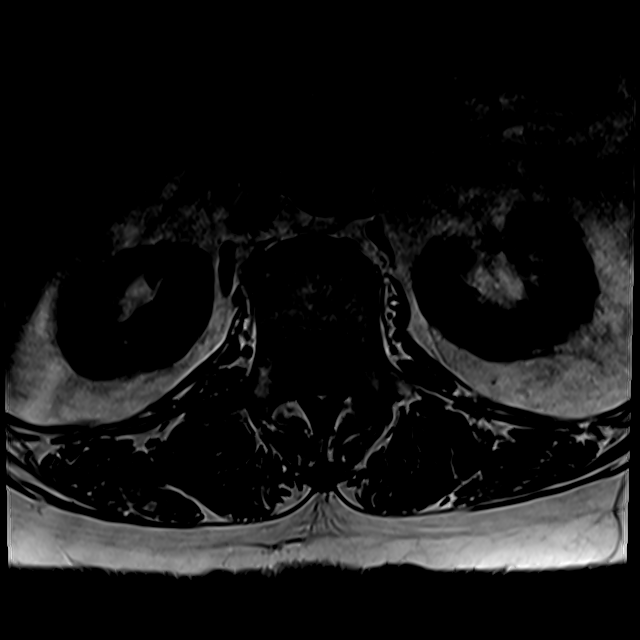

[24 of 48 positions shown; findings below may reference images not displayed]

FINDINGS: Segmentation: Normal lumbar segmentation demonstrated on the prior
radiographs.

Alignment: Stable vertebral height and alignment since [DATE], mild
straightening of lower lumbar lordosis. Subtle retrolisthesis of L1
on L2 and L3 on L4.

Vertebrae: Visible bone marrow signal is mildly heterogeneous but
within normal limits. No marrow edema or evidence of acute osseous
abnormality. Intact visible sacrum and SI joints.

Conus medullaris and cauda equina: Conus extends to the L1 level.
Conus and cauda equina appear normal.

Paraspinal and other soft tissues: Negative.

Disc levels:

T11-T12: Negative.

T12-L1: Small left paracentral disc protrusion or extrusion (series
14, image 2). No associated stenosis.

L1-L2: Disc space loss with circumferential disc bulge and
superimposed broad-based mild right paracentral disc protrusion
(series 14, image 10). No stenosis.

L2-L3: Mild circumferential disc bulge eccentric to the right. Mild
facet hypertrophy. No significant stenosis.

L3-L4: Circumferential disc bulge with small central annular fissure
of the disc best seen on series 8, image 8. Mild to moderate facet
and ligament flavum hypertrophy. Borderline to mild spinal stenosis.
No convincing lateral recess stenosis. No foraminal stenosis.

L4-L5: Circumferential disc bulge with broad-based posterior and
right foraminal component. Associated endplate spurring.
Superimposed small suspected right lateral recess disc protrusion
(series 14, image 30). Mild to moderate facet hypertrophy. Mild left
lateral recess stenosis (descending left L5 nerve level, but
moderate right lateral recess stenosis at the right L5 nerve level.
Mild right L4 neural foraminal stenosis. No significant spinal
stenosis.

L5-S1: Subtle anterolisthesis. Circumferential disc bulge and
endplate spurring. Moderate to severe facet hypertrophy greater on
the left. Mild spinal stenosis. Mild left greater than right lateral
recess stenosis (S1 nerve levels). Mild to moderate left L5 neural
foraminal stenosis. No right foraminal stenosis.
IMPRESSION: 1. Widespread lumbar disc degeneration with disc bulging. No left
left-sided disc herniation, aside from a small disc protrusion at
T12-L1 without convincing neural impingement. Although there is a
small right side disc herniation suspected at L4-L5, contributing to
moderate stenosis at the level of the right L5 nerve.
2. But there is mild multifactorial spinal stenosis at both L3-L4
and L5-S1, and at the latter there is multifactorial mild to
moderate left neural foraminal and left lateral recess stenosis, at
the left L5 and left S1 nerve levels - respectively.
3. No acute osseous abnormality, but moderate to severe chronic
facet degeneration at L5-S1, greater on the left.

## 2019-01-03 ENCOUNTER — Other Ambulatory Visit: Payer: Self-pay

## 2019-01-03 ENCOUNTER — Encounter: Payer: Self-pay | Admitting: Cardiology

## 2019-01-03 ENCOUNTER — Ambulatory Visit: Payer: Self-pay | Admitting: Cardiology

## 2019-01-03 ENCOUNTER — Ambulatory Visit: Payer: Medicare Other | Admitting: Cardiology

## 2019-01-03 VITALS — BP 142/81 | HR 48 | Ht 62.0 in | Wt 190.0 lb

## 2019-01-03 DIAGNOSIS — R001 Bradycardia, unspecified: Secondary | ICD-10-CM

## 2019-01-03 DIAGNOSIS — R0600 Dyspnea, unspecified: Secondary | ICD-10-CM

## 2019-01-03 DIAGNOSIS — R55 Syncope and collapse: Secondary | ICD-10-CM

## 2019-01-03 DIAGNOSIS — R0609 Other forms of dyspnea: Secondary | ICD-10-CM | POA: Diagnosis not present

## 2019-01-03 NOTE — Progress Notes (Signed)
Subjective:   Cynthia Farrell, female    DOB: 06-11-1946, 73 y.o.   MRN: [id number]  Cynthia Gravel, MD:  Chief Complaint  Patient presents with   Loss of Consciousness    pt was out in yard today and got dizzy/felt like she was going to pass out    Bradycardia   This visit type was conducted due to national recommendations for restrictions regarding the COVID-19 Pandemic (e.g. social distancing).  This format is felt to be most appropriate for this patient at this time.  All issues noted in this document were discussed and addressed.  No physical exam was performed (except for noted visual exam findings with Telehealth visits).  The patient has consented to conduct a Telehealth visit and understands insurance will be billed.   I discussed the limitations of evaluation and management by telemedicine and the availability of in person appointments. The patient expressed understanding and agreed to proceed.  Virtual Visit via Video Note is as below  I connected with Cynthia Farrell, on 01/03/2019 at 1530 by telephone and verified that I am speaking with the correct person using two identifiers. Unable to perform video visit as patient did not have capability of this and telephone visit was felt to be appropriate.    I have discussed with her regarding the safety during COVID Pandemic and steps and precautions including social distancing with the patient.    HPI: Cynthia Farrell  is a 73 y.o. female  with mild hypertension, hyperdynamic LV, chronic palpitations (resolved with diltiazem), moderate obesity, history of rheumatoid arthritis and also lost arthritis, fibromyalgia presents here for follow-up of recurrent episodes of near syncope that has been occurring over the past 2 years. She's had normal coronary angiogram in 2002. Carotid duplex in 2016 was normal.   Episodes of near syncopal and of occurred both during routine activities at home and also while she was sitting, feels disoriented  and fatigue after the event. Describes as "spells" and feels like she will pass out and has mental cloudiness and comes on suddenly. She recently wore a 30 day event monitor that was unyielding; however, she did have a few episodes where her heart rate dropped down into the 40's that she recalls having symptoms during that time, but did not report symptoms through the monitor. As I felt that her episodes could possibly be related to bradycardia, I had decreased her dose of Diltiazem. This is a 3 month office visit.  She reports have 3 episodes since last seen by me, including one episode today. Reports just prior to her phone call appt today, she was out in her yard for several hours picking up limbs and when she came in she felt very weak, lightheaded, and could not think clearly. She did note that her BP machine read her pulse as 48.   I had planned to discuss her symptoms the next day (04/15) with Cynthia Farrell and possibly discontinuing her diltiazem, but prior to this, she called the office to follow up. She admits to frequently checking her heart rate through out the rest of the night and states that it ran mostly in the low 60's. Today (04/15), she had felt very well, but did note that her heart rate had dropped back down into the 40's without any symptoms.   She has had neurologic evaluation with Dr. Jaynee Farrell in 2016, who recommended MRI/MRA of the brain and EEG; however, it does not appear that this was performed.  There is a remote history of A fib that patient states occurred in early 90's that converted after she was started on diltiazem. I do not have any records or documentation of this. She also had NSVT during a treadmill stress test in 2002 and underwent coronary angiogram at that time that was normal. It appears that she has had repeat treadmill stress test in 2012 that showed Multifocal PVC.   Past Medical History:  Diagnosis Date   Atrial fibrillation ([place])    Breast cancer ([place])     Diverticular disease    Esophageal stricture    Fibromyalgia    GERD (gastroesophageal reflux disease)    Hemorrhoids    Hyperlipemia    Hypertension    Rheumatoid aortitis     Past Surgical History:  Procedure Laterality Date   [place] & 2005   CHOLECYSTECTOMY  1999   COLONOSCOPY  02/12/2012   Procedure: COLONOSCOPY;  Surgeon: Inda Castle, MD;  Location: [facility];  Service: Endoscopy;  Laterality: N/A;   ESOPHAGOGASTRODUODENOSCOPY  02/12/2012   Procedure: ESOPHAGOGASTRODUODENOSCOPY (EGD);  Surgeon: Inda Castle, MD;  Location: [facility];  Service: Endoscopy;  Laterality: N/A;   HOT HEMOSTASIS  02/12/2012   Procedure: HOT HEMOSTASIS (ARGON PLASMA COAGULATION/BICAP);  Surgeon: Inda Castle, MD;  Location: [facility];  Service: Endoscopy;  Laterality: N/A;   MASTECTOMY  1986   THYROID CYST EXCISION  2000   TONSILLECTOMY  1968    Family History  Problem Relation Age of Onset   Diabetes Mother    Heart disease Mother    Heart disease Father    Kidney disease Maternal Grandmother    Liver disease Maternal Grandfather    Heart disease Paternal Grandmother    Breast cancer Paternal Grandmother    Heart disease Paternal Grandfather    Colon cancer Neg Hx     Social History   Socioeconomic History   Marital status: Married    Spouse name: Cynthia Farrell.   Number of children: 2   Years of education: 12   Highest education level: Not on file  Occupational History   Occupation: Sales    Comment: Engineer, water strain: Not on file   Food insecurity:    Worry: Not on file    Inability: Not on file   Transportation needs:    Medical: Not on file    Non-medical: Not on file  Tobacco Use   Smoking status: Never Smoker   Smokeless tobacco: Never Used  Substance and Sexual Activity    Alcohol use: No   Drug use: No   Sexual activity: Not on file  Lifestyle   Physical activity:    Days per week: Not on file    Minutes per session: Not on file   Stress: Not on file  Relationships   Social connections:    Talks on phone: Not on file    Gets together: Not on file    Attends religious service: Not on file    Active member of club or organization: Not on file    Attends meetings of clubs or organizations: Not on file    Relationship status: Not on file   Intimate partner violence:    Fear of current or ex partner: Not on file    Emotionally abused: Not on file    Physically  abused: Not on file    Forced sexual activity: Not on file  Other Topics Concern   Not on file  Social History Narrative   Lives at home with husband.   Caffeine use: Drinks decaf tea   Coffee very rarely    Current Meds  Medication Sig   amoxicillin (AMOXIL) 500 MG capsule Take 500 mg by mouth. Take 4 capsules prior to having teeth cleaned or dental procedure   aspirin EC 81 MG tablet Take 81 mg by mouth daily.   cyanocobalamin 2000 MCG tablet Take 1,000 mcg by mouth daily.    diltiazem (CARDIZEM SR) 90 MG 12 hr capsule Take 90 mg by mouth 2 (two) times daily.   ezetimibe (ZETIA) 10 MG tablet Take 10 mg by mouth daily.   folic acid (FOLVITE) 606 MCG tablet Take 800 mcg by mouth daily.   HYDROcodone-acetaminophen (VICODIN ES) 7.5-750 MG per tablet Take 1 tablet by mouth every 6 (six) hours as needed.   losartan (COZAAR) 25 MG tablet Take 25 mg by mouth daily.   Menaquinone-7 (VITAMIN K2) 100 MCG CAPS Take by mouth.   omeprazole-sodium bicarbonate (ZEGERID) 40-1100 MG per capsule Take 1 capsule by mouth 2 (two) times daily.    traMADol (ULTRAM) 50 MG tablet Take 50 mg by mouth 3 (three) times daily. As directed   zolpidem (AMBIEN) 10 MG tablet Take 5 mg by mouth at bedtime as needed.    [DISCONTINUED] leflunomide (ARAVA) 10 MG tablet Take 20 mg by mouth daily.       Review of Systems  Constitution: Negative for decreased appetite, malaise/fatigue, weight gain and weight loss.  Eyes: Negative for visual disturbance.  Cardiovascular: Positive for dyspnea on exertion and near-syncope. Negative for chest pain, claudication, leg swelling, orthopnea, palpitations and syncope.  Respiratory: Negative for hemoptysis and wheezing.   Endocrine: Negative for cold intolerance and heat intolerance.  Hematologic/Lymphatic: Does not bruise/bleed easily.  Skin: Negative for nail changes.  Musculoskeletal: Positive for joint pain. Negative for muscle weakness and myalgias.  Gastrointestinal: Negative for abdominal pain, change in bowel habit, nausea and vomiting.  Neurological: Negative for difficulty with concentration, dizziness, focal weakness and headaches.  Psychiatric/Behavioral: Negative for altered mental status and suicidal ideas.  All other systems reviewed and are negative.      Objective:     Blood pressure (!) 142/81, pulse (!) 48, height 5\' 2"  (1.575 m), weight 190 lb (86.2 kg).  Cardiac studies:  30 day event monitor 07/27/2018-08/25/2018: 2 patient triggered events with reported symptoms related with normal sinus rhythm with first-degree block. Lowest bradycardiac episode occurred on day 19 (11/24) at 10:31 AM with HR at 44 bpm that patient stated she did have symptoms of near syncope during that time. No high degree AV block, A. fib, or SVT noted.  Echocardiogram 06/13/2018: Left ventricle cavity is normal in size. Normal global wall motion. Doppler evidence of grade I (impaired) diastolic dysfunction, normal LAP. Calculated EF 66%. Left atrial cavity is mildly dilated at 4 cm and 52 mL. Structurally normal trileaflet aortic valve with no regurgitation noted. There is mild pressure gradient across the AV with mean of 10 mm Hg and peak 18 mm Hg, AVA 1.99 cm. Compared to the study done on 03/23/2017, no significant change.  Cardiac cath 2002:  Hyperdynamic LV and normal coronary arteries: Done for chest pain and exercise treadmill stress exercise-induced nonsustained ventricular tachycardia. Catherization 1994 and 1995 Normal  Carotid artery duplex 06/13/2018: No hemodynamically significant arterial disease in  the internal carotid artery bilaterally. No significant plaque noted as well. Antegrade right vertebral artery flow. Antegrade left vertebral artery flow. Compared to the study done on 02/06/2015, no significant change. Incidental right thyroid nodule noted. Consider dedicated scan.  Laboratory examination: No recent labs 03/25/2016: CBC normal, creatinine 0.99, potassium 4.6, ALT 60, alkaline phosphatase 192, CMP otherwise normal, total cholesterol 168, triglycerides 143, HDL 45, LDL 94, sedimentation rate normal, TSH 1.65, vitamin D 66        Assessment & Recommendations:  Near syncope  Sinus bradycardia  Dyspnea on exertion    EKG 05/16/2018: Sinus bradycardia at 56 bpm, normal axis, normal interval, no evidence of ischemia. Normal EKG.  Plan: Despite decreasing her dose of diltiazem, she continues to have episodes of marked bradycardia that can be both symptomatic or asymptomatic. I am unsure that her near syncope is related to this. Etiology for her episodes is still uncertain. May possibly be related to vasovagal near syncope, but also cannot exclude arrhythmia etiology. She may benefit from loop recorder implantation. I have discussed the procedure and benefits of the device. She wishes to discuss with her husband as well as do some research on this prior to making a decision that I feel is reasonable. We could also consider discontinuing her diltiazem to see if her symptoms improve; however, she has history of LVOT obstruction and hyperdynamic LV, although resolved on last echo, could worsen again with stopping diltiazem and make dyspnea on exertion worse. Dyspnea on exertion is currently mild. Also has history of  palpitations with exercise induced NSVT in the past. As she has not been keeping record or paying much attention to her symptoms for the last several months, I have asked her to start keeping a diary of her symptoms including her activities prior to her near syncope episodes, symptoms, and heart rate and BP at the time for further evaluation to see if we can better associate her symptoms.   She is without symptoms of chest pain and has had normal coronary angiogram in the past. Suspicion for CAD as etiology is low; however, she has not had stress test in several years. Dyspnea on exertion is stable. No leg edema, PND, or orthopnea. Neurological evaluation should also be considered. I do not have recent labs, will request from PCP office for evaluation. Blood pressure is well controlled. I would like for her to see Cynthia Farrell in about 4-6 weeks for follow up on her symptoms. Encouraged her to contact me for any questions or concerns.   Jeri Lager, MSN, APRN, FNP-C [facility], [place] Office: [phone] Fax: [phone]

## 2019-01-05 ENCOUNTER — Encounter: Payer: Self-pay | Admitting: Cardiology

## 2019-01-06 ENCOUNTER — Telehealth: Payer: Self-pay

## 2019-01-06 NOTE — Telephone Encounter (Signed)
Pt last seen 4/14. Called to get clarification on her Diltiazem. She said that you told her to just stop. She and her husband read up on this and it says not to just stop suddenly.Also if she does stop, what to do if symptoms-SOB, etc reoccur. She had some fluttering yesterday and fatigue. Bp last night was 159/87 w/ 61hr and 160/89 w/ 57hr. Please advise.//ah

## 2019-01-06 NOTE — Telephone Encounter (Signed)
I discussed with patient over the phone that she should resume her diltiazem as previously taking. There was some confusion in my instructions to her about the medication. She will follow up as scheduled.

## 2019-02-09 DIAGNOSIS — Z79899 Other long term (current) drug therapy: Secondary | ICD-10-CM | POA: Diagnosis not present

## 2019-02-09 DIAGNOSIS — M79643 Pain in unspecified hand: Secondary | ICD-10-CM | POA: Diagnosis not present

## 2019-02-09 DIAGNOSIS — M0609 Rheumatoid arthritis without rheumatoid factor, multiple sites: Secondary | ICD-10-CM | POA: Diagnosis not present

## 2019-02-09 DIAGNOSIS — M1990 Unspecified osteoarthritis, unspecified site: Secondary | ICD-10-CM | POA: Diagnosis not present

## 2019-02-14 ENCOUNTER — Ambulatory Visit: Payer: Medicare Other | Admitting: Cardiology

## 2019-02-14 NOTE — Progress Notes (Deleted)
Primary Physician:  Jani Gravel, MD   Patient ID: Cynthia Farrell, female    DOB: 12/02/1945, 73 y.o.   MRN: [id number]  Subjective:    No chief complaint on file.   HPI: Cynthia Farrell  is a 73 y.o. female  with mild hypertension, hyperdynamic LV, chronic palpitations (resolved with diltiazem), moderate obesity, history of rheumatoid arthritis and also lost arthritis, fibromyalgia presents here for follow-up of recurrent episodes of near syncope that has been occurring over the past 2 years. She's had normal coronary angiogram in 2002. Carotid duplex in 2016 was normal.   Episodes of near syncopal and of occurred both during routine activities at home and also while she was sitting, feels disoriented and fatigue after the event. Describes as "spells" and feels like she will pass out and has mental cloudiness and comes on suddenly. She recently wore a 30 day event monitor that was unyielding; however, she did have a few episodes where her heart rate dropped down into the 40's that she recalls having symptoms during that time, but did not report symptoms through the monitor. As I felt that her episodes could possibly be related to bradycardia, I had decreased her dose of Diltiazem. This is a 3 month office visit.  She reports have 3 episodes since last seen by me, including one episode today. Reports just prior to her phone call appt today, she was out in her yard for several hours picking up limbs and when she came in she felt very weak, lightheaded, and could not think clearly. She did note that her BP machine read her pulse as 48.   I had planned to discuss her symptoms the next day (04/15) with Dr. Einar Gip and possibly discontinuing her diltiazem, but prior to this, she called the office to follow up. She admits to frequently checking her heart rate through out the rest of the night and states that it ran mostly in the low 60's. Today (04/15), she had felt very well, but did note that her heart  rate had dropped back down into the 40's without any symptoms.   She has had neurologic evaluation with Dr. Jaynee Eagles in 2016, who recommended MRI/MRA of the brain and EEG; however, it does not appear that this was performed.   There is a remote history of A fib that patient states occurred in early 90's that converted after she was started on diltiazem. I do not have any records or documentation of this. She also had NSVT during a treadmill stress test in 2002 and underwent coronary angiogram at that time that was normal. It appears that she has had repeat treadmill stress test in 2012 that showed Multifocal PVC.  Past Medical History:  Diagnosis Date   Atrial fibrillation ([place])    Breast cancer ([place])    Diverticular disease    Esophageal stricture    Fibromyalgia    GERD (gastroesophageal reflux disease)    Hemorrhoids    Hyperlipemia    Hypertension    Rheumatoid aortitis     Past Surgical History:  Procedure Laterality Date   [place] & 2005   CHOLECYSTECTOMY  1999   COLONOSCOPY  02/12/2012   Procedure: COLONOSCOPY;  Surgeon: Inda Castle, MD;  Location: [facility];  Service: Endoscopy;  Laterality: N/A;   ESOPHAGOGASTRODUODENOSCOPY  02/12/2012   Procedure: ESOPHAGOGASTRODUODENOSCOPY (EGD);  Surgeon: Inda Castle,  MD;  Location: [facility];  Service: Endoscopy;  Laterality: N/A;   HOT HEMOSTASIS  02/12/2012   Procedure: HOT HEMOSTASIS (ARGON PLASMA COAGULATION/BICAP);  Surgeon: Inda Castle, MD;  Location: [facility];  Service: Endoscopy;  Laterality: N/A;   MASTECTOMY  1986   THYROID CYST EXCISION  2000   TONSILLECTOMY  1968    Social History   Socioeconomic History   Marital status: Married    Spouse name: Orva Riles.   Number of children: 2   Years of education: 12   Highest education level: Not on file  Occupational History     Occupation: Sales    Comment: Engineer, water strain: Not on file   Food insecurity:    Worry: Not on file    Inability: Not on file   Transportation needs:    Medical: Not on file    Non-medical: Not on file  Tobacco Use   Smoking status: Never Smoker   Smokeless tobacco: Never Used  Substance and Sexual Activity   Alcohol use: No   Drug use: No   Sexual activity: Not on file  Lifestyle   Physical activity:    Days per week: Not on file    Minutes per session: Not on file   Stress: Not on file  Relationships   Social connections:    Talks on phone: Not on file    Gets together: Not on file    Attends religious service: Not on file    Active member of club or organization: Not on file    Attends meetings of clubs or organizations: Not on file    Relationship status: Not on file   Intimate partner violence:    Fear of current or ex partner: Not on file    Emotionally abused: Not on file    Physically abused: Not on file    Forced sexual activity: Not on file  Other Topics Concern   Not on file  Social History Narrative   Lives at home with husband.   Caffeine use: Drinks decaf tea   Coffee very rarely    Review of Systems  Constitution: Negative for decreased appetite, malaise/fatigue, weight gain and weight loss.  Eyes: Negative for visual disturbance.  Cardiovascular: Positive for near-syncope. Negative for chest pain, claudication, dyspnea on exertion, leg swelling, orthopnea, palpitations and syncope.  Respiratory: Negative for hemoptysis and wheezing.   Endocrine: Negative for cold intolerance and heat intolerance.  Hematologic/Lymphatic: Does not bruise/bleed easily.  Skin: Negative for nail changes.  Musculoskeletal: Positive for joint pain. Negative for muscle weakness and myalgias.  Gastrointestinal: Negative for abdominal pain, change in bowel habit, nausea and vomiting.  Neurological: Negative for difficulty  with concentration, dizziness, focal weakness and headaches.  Psychiatric/Behavioral: Negative for altered mental status and suicidal ideas.  All other systems reviewed and are negative.     Objective:  There were no vitals taken for this visit. There is no height or weight on file to calculate BMI.    Physical Exam  Constitutional: She is oriented to person, place, and time. Vital signs are normal. She appears well-developed and well-nourished.  Moderately obese  HENT:  Head: Normocephalic and atraumatic.  Neck: Normal range of motion.  Cardiovascular: Normal rate, regular rhythm and intact distal pulses.  Murmur heard.  Early systolic murmur is present with a grade of 2/6 at the upper right sternal border and apex. Pulses:      Carotid pulses  are on the right side with bruit and on the left side with bruit. Pulmonary/Chest: Effort normal and breath sounds normal. No accessory muscle usage. No respiratory distress.  Abdominal: Soft. Bowel sounds are normal.  Musculoskeletal: Normal range of motion.  Neurological: She is alert and oriented to person, place, and time.  Skin: Skin is warm and dry.  Vitals reviewed.  Radiology: No results found.  Laboratory examination:   *** CMP Latest Ref Rng & Units 04/10/2012  Glucose 70 - 99 mg/dL 124(H)  BUN 6 - 23 mg/dL 19  Creatinine 0.50 - 1.10 mg/dL 0.89  Sodium 135 - 145 mEq/L 136  Potassium 3.5 - 5.1 mEq/L 4.7  Chloride 96 - 112 mEq/L 101  CO2 19 - 32 mEq/L 27  Calcium 8.4 - 10.5 mg/dL 9.7  Total Protein 6.0 - 8.3 g/dL 7.2  Total Bilirubin 0.3 - 1.2 mg/dL 0.2(L)  Alkaline Phos 39 - 117 U/L 175(H)  AST 0 - 37 U/L 18  ALT 0 - 35 U/L 22   CBC Latest Ref Rng & Units 04/10/2012 03/28/2012 02/11/2012  WBC 4.0 - 10.5 K/uL 10.0 7.7 9.4  Hemoglobin 12.0 - 15.0 g/dL 12.3 11.3(L) 9.5(L)  Hematocrit 36.0 - 46.0 % 38.9 35.3(L) 28.7(L)  Platelets 150 - 400 K/uL 273 323.0 354.0   Lipid Panel  No results found for: CHOL, TRIG, HDL, CHOLHDL,  VLDL, LDLCALC, LDLDIRECT HEMOGLOBIN A1C No results found for: HGBA1C, MPG TSH No results for input(s): TSH in the last 8760 hours.  PRN Meds:. There are no discontinued medications. No outpatient medications have been marked as taking for the 02/14/19 encounter (Appointment) with Miquel Dunn, NP.    Cardiac Studies:   30 day event monitor 07/27/2018-08/25/2018: 2 patient triggered events with reported symptoms related with normal sinus rhythm with first-degree block. Lowest bradycardiac episode occurred on day 19 (11/24) at 10:31 AM with HR at 44 bpm that patient stated she did have symptoms of near syncope during that time. No high degree AV block, A. fib, or SVT noted.  Echocardiogram 06/13/2018: Left ventricle cavity is normal in size. Normal global wall motion. Doppler evidence of grade I (impaired) diastolic dysfunction, normal LAP. Calculated EF 66%. Left atrial cavity is mildly dilated at 4 cm and 52 mL. Structurally normal trileaflet aortic valve with no regurgitation noted. There is mild pressure gradient across the AV with mean of 10 mm Hg and peak 18 mm Hg, AVA 1.99 cm. Compared to the study done on 03/23/2017, no significant change.  Cardiac cath 2002: Hyperdynamic LV and normal coronary arteries: Done for chest pain and exercise treadmill stress exercise-induced nonsustained ventricular tachycardia. Catherization 1994 and 1995 Normal  Carotid artery duplex 06/13/2018: No hemodynamically significant arterial disease in the internal carotid artery bilaterally. No significant plaque noted as well. Antegrade right vertebral artery flow. Antegrade left vertebral artery flow. Compared to the study done on 02/06/2015, no significant change. Incidental right thyroid nodule noted. Consider dedicated scan.  Assessment:   No diagnosis found.  EKG 05/16/2018: Sinus bradycardia at 56 bpm, normal axis, normal interval, no evidence of ischemia. Normal EKG.  Recommendations:    Despite decreasing her dose of diltiazem, she continues to have episodes of marked bradycardia that can be both symptomatic or asymptomatic. I am unsure that her near syncope is related to this. Etiology for her episodes is still uncertain. May possibly be related to vasovagal near syncope, but also cannot exclude arrhythmia etiology. She may benefit from loop recorder implantation. I have discussed  the procedure and benefits of the device. She wishes to discuss with her husband as well as do some research on this prior to making a decision that I feel is reasonable. We could also consider discontinuing her diltiazem to see if her symptoms improve; however, she has history of LVOT obstruction and hyperdynamic LV, although resolved on last echo, could worsen again with stopping diltiazem and make dyspnea on exertion worse. Dyspnea on exertion is currently mild. Also has history of palpitations with exercise induced NSVT in the past. As she has not been keeping record or paying much attention to her symptoms for the last several months, I have asked her to start keeping a diary of her symptoms including her activities prior to her near syncope episodes, symptoms, and heart rate and BP at the time for further evaluation to see if we can better associate her symptoms.   She is without symptoms of chest pain and has had normal coronary angiogram in the past. Suspicion for CAD as etiology is low; however, she has not had stress test in several years. Dyspnea on exertion is stable. No leg edema, PND, or orthopnea. Neurological evaluation should also be considered. I do not have recent labs, will request from PCP office for evaluation. Blood pressure is well controlled. I would like for her to see Dr. Einar Gip in about 4-6 weeks for follow up on her symptoms. Encouraged her to contact me for any questions or concerns.  Miquel Dunn, MSN, APRN, FNP-C [facility]. [place] Office: [phone] Fax:  [phone]

## 2019-02-17 ENCOUNTER — Encounter: Payer: Self-pay | Admitting: Cardiology

## 2019-02-17 ENCOUNTER — Ambulatory Visit: Payer: Medicare Other | Admitting: Cardiology

## 2019-02-17 ENCOUNTER — Other Ambulatory Visit: Payer: Self-pay

## 2019-02-17 VITALS — BP 145/88 | HR 60 | Ht 62.0 in | Wt 189.6 lb

## 2019-02-17 DIAGNOSIS — I10 Essential (primary) hypertension: Secondary | ICD-10-CM

## 2019-02-17 DIAGNOSIS — R0609 Other forms of dyspnea: Secondary | ICD-10-CM

## 2019-02-17 DIAGNOSIS — R0600 Dyspnea, unspecified: Secondary | ICD-10-CM

## 2019-02-17 DIAGNOSIS — R55 Syncope and collapse: Secondary | ICD-10-CM | POA: Diagnosis not present

## 2019-02-17 DIAGNOSIS — R001 Bradycardia, unspecified: Secondary | ICD-10-CM | POA: Diagnosis not present

## 2019-02-17 NOTE — Progress Notes (Signed)
Primary Physician:  Jani Gravel, MD   Patient ID: Cynthia Farrell, female    DOB: 1946-03-18, 73 y.o.   MRN: [id number]  Subjective:    Chief Complaint  Patient presents with  . Hypertension  . Palpitations  . Follow-up    HPI: Cynthia Farrell  is a 73 y.o. female  with mild hypertension, hyperdynamic LV, chronic palpitations (resolved with diltiazem), moderate obesity, history of rheumatoid arthritis and also osteo arthritis, fibromyalgia presents here for follow-up of recurrent episodes of near syncope that has been occurring over the past 2 years. She's had normal coronary angiogram in 2002. Carotid duplex in 2016 was normal.  Her echocardiogram essentially revealed hyperdynamic LV with mild intraventricular pressure gradient.  She has chronic underlying sinus bradycardia.  Episodes of near syncopal and of occurred both during routine activities at home and also while she was sitting, feels disoriented and fatigue after the event. Describes as "spells" and feels like she will pass out and has mental cloudiness and comes on suddenly. She recently wore a 30 day event monitor that was unyielding; however, she did have a few episodes where her heart rate dropped down into the 40's that she recalls having symptoms during that time, but did not report symptoms through the monitor. As I felt that her episodes could possibly be related to bradycardia, I had decreased her dose of Diltiazem; however, she continued to have bradycardia episodes despite this that were both symptomatic and asymptomatic. I had asked her to keep a diary of her symptoms for better assess.   There is a remote history of A fib that patient states occurred in early 90's that converted after she was started on diltiazem. I do not have any records or documentation of this. She also had NSVT during a treadmill stress test in 2002 and underwent coronary angiogram at that time that was normal. It appears that she has had repeat  treadmill stress test in 2012 that showed Multifocal PVC.  She brings in a record a diary of her events, over the past 6 weeks he has not had any further events of near syncope except one very mild episode where blood pressure and heart rate were essentially normal.  No new symptomatology.  She has never had a frank syncope.  Past Medical History:  Diagnosis Date  . Atrial fibrillation ([place])   . Breast cancer ([place])   . Diverticular disease   . Esophageal stricture   . Fibromyalgia   . GERD (gastroesophageal reflux disease)   . Hemorrhoids   . Hyperlipemia   . Hypertension   . Rheumatoid aortitis     Past Surgical History:  Procedure Laterality Date  . ABDOMINAL HYSTERECTOMY  1995  . APPENDECTOMY  1956  . BREAST BIOPSY  1972  . [place] & 2005  . CHOLECYSTECTOMY  1999  . COLONOSCOPY  02/12/2012   Procedure: COLONOSCOPY;  Surgeon: Inda Castle, MD;  Location: [facility];  Service: Endoscopy;  Laterality: N/A;  . ESOPHAGOGASTRODUODENOSCOPY  02/12/2012   Procedure: ESOPHAGOGASTRODUODENOSCOPY (EGD);  Surgeon: Inda Castle, MD;  Location: [facility];  Service: Endoscopy;  Laterality: N/A;  . HOT HEMOSTASIS  02/12/2012   Procedure: HOT HEMOSTASIS (ARGON PLASMA COAGULATION/BICAP);  Surgeon: Inda Castle, MD;  Location: [facility];  Service: Endoscopy;  Laterality: N/A;  . MASTECTOMY  1986  . THYROID CYST EXCISION  2000  . TONSILLECTOMY  1968    Social History   Socioeconomic History  . Marital  status: Married    Spouse name: Dazani Norby.  . Number of children: 2  . Years of education: 25  . Highest education level: Not on file  Occupational History  . Occupation: Press photographer    Comment: [employer]  Social Needs  . Financial resource strain: Not on file  . Food insecurity:    Worry: Not on file    Inability: Not on file  . Transportation needs:    Medical: Not on file    Non-medical: Not on file  Tobacco Use  . Smoking status: Never  Smoker  . Smokeless tobacco: Never Used  Substance and Sexual Activity  . Alcohol use: No  . Drug use: No  . Sexual activity: Not on file  Lifestyle  . Physical activity:    Days per week: Not on file    Minutes per session: Not on file  . Stress: Not on file  Relationships  . Social connections:    Talks on phone: Not on file    Gets together: Not on file    Attends religious service: Not on file    Active member of club or organization: Not on file    Attends meetings of clubs or organizations: Not on file    Relationship status: Not on file  . Intimate partner violence:    Fear of current or ex partner: Not on file    Emotionally abused: Not on file    Physically abused: Not on file    Forced sexual activity: Not on file  Other Topics Concern  . Not on file  Social History Narrative   Lives at home with husband.   Caffeine use: Drinks decaf tea   Coffee very rarely    Review of Systems  Constitution: Negative for decreased appetite, malaise/fatigue, weight gain and weight loss.  Eyes: Negative for visual disturbance.  Cardiovascular: Positive for dyspnea on exertion (chronic) and near-syncope. Negative for chest pain, claudication, leg swelling, orthopnea, palpitations and syncope.  Respiratory: Negative for hemoptysis and wheezing.   Endocrine: Negative for cold intolerance and heat intolerance.  Hematologic/Lymphatic: Does not bruise/bleed easily.  Skin: Negative for nail changes.  Musculoskeletal: Positive for joint pain. Negative for muscle weakness and myalgias.  Gastrointestinal: Negative for abdominal pain, change in bowel habit, nausea and vomiting.  Neurological: Negative for difficulty with concentration, dizziness, focal weakness and headaches.  Psychiatric/Behavioral: Negative for altered mental status and suicidal ideas.  All other systems reviewed and are negative.     Objective:  Blood pressure (!) 145/88, pulse 60, height 5\' 2"  (1.575 m), weight 189  lb 9.6 oz (86 kg), SpO2 98 %. Body mass index is 34.68 kg/m.    Physical Exam  Constitutional: She is oriented to person, place, and time. Vital signs are normal. She appears well-developed and well-nourished.  Moderately obese  HENT:  Head: Normocephalic and atraumatic.  Neck: Normal range of motion.  Cardiovascular: Normal rate, regular rhythm and intact distal pulses.  Murmur heard.  Early systolic murmur is present with a grade of 2/6 at the upper right sternal border and apex. Pulses:      Carotid pulses are on the right side with bruit and on the left side with bruit. Pulmonary/Chest: Effort normal and breath sounds normal. No accessory muscle usage. No respiratory distress.  Abdominal: Soft. Bowel sounds are normal.  Musculoskeletal: Normal range of motion.  Neurological: She is alert and oriented to person, place, and time.  Skin: Skin is warm and dry.  Vitals reviewed.  Radiology: No results found.  Laboratory examination:    PRN Meds:. Medications Discontinued During This Encounter  Medication Reason  . Menaquinone-7 (VITAMIN K2) 100 MCG CAPS Error  . pravastatin (PRAVACHOL) 40 MG tablet Error   Current Meds  Medication Sig  . amoxicillin (AMOXIL) 500 MG capsule Take 500 mg by mouth. Take 4 capsules prior to having teeth cleaned or dental procedure  . aspirin EC 81 MG tablet Take 81 mg by mouth daily.  . cyanocobalamin 2000 MCG tablet Take 500 mcg by mouth daily.   Marland Kitchen ezetimibe (ZETIA) 10 MG tablet Take 10 mg by mouth daily.  . folic acid (FOLVITE) 350 MCG tablet Take 800 mcg by mouth daily.  Marland Kitchen HYDROcodone-acetaminophen (VICODIN ES) 7.5-750 MG per tablet Take 1 tablet by mouth every 6 (six) hours as needed.  . leflunomide (ARAVA) 20 MG tablet Take 20 mg by mouth daily.  Marland Kitchen losartan (COZAAR) 25 MG tablet Take 25 mg by mouth daily.  Marland Kitchen omeprazole-sodium bicarbonate (ZEGERID) 40-1100 MG per capsule Take 1 capsule by mouth daily.   . predniSONE (DELTASONE) 5 MG tablet  Take 5 mg by mouth 2 (two) times a day.  . traMADol (ULTRAM) 50 MG tablet Take 50 mg by mouth 3 (three) times daily. As directed  . zolpidem (AMBIEN) 10 MG tablet Take 5 mg by mouth at bedtime as needed.   . [DISCONTINUED] losartan (COZAAR) 25 MG tablet Take 25 mg by mouth daily.    Cardiac Studies:   30 day event monitor 07/27/2018-08/25/2018: 2 patient triggered events with reported symptoms related with normal sinus rhythm with first-degree block. Lowest bradycardiac episode occurred on day 19 (11/24) at 10:31 AM with HR at 44 bpm that patient stated she did have symptoms of near syncope during that time. No high degree AV block, A. fib, or SVT noted.  Echocardiogram 06/13/2018: Left ventricle cavity is normal in size. Normal global wall motion. Doppler evidence of grade I (impaired) diastolic dysfunction, normal LAP. Calculated EF 66%. Left atrial cavity is mildly dilated at 4 cm and 52 mL. Structurally normal trileaflet aortic valve with no regurgitation noted. There is mild pressure gradient across the AV with mean of 10 mm Hg and peak 18 mm Hg, AVA 1.99 cm. Compared to the study done on 03/23/2017, no significant change.  Cardiac cath 2002: Hyperdynamic LV and normal coronary arteries: Done for chest pain and exercise treadmill stress exercise-induced nonsustained ventricular tachycardia. Catherization 1994 and 1995 Normal  Carotid artery duplex 06/13/2018: No hemodynamically significant arterial disease in the internal carotid artery bilaterally. No significant plaque noted as well. Antegrade right vertebral artery flow. Antegrade left vertebral artery flow. Compared to the study done on 02/06/2015, no significant change. Incidental right thyroid nodule noted. Consider dedicated scan.  Assessment:   Counter-pressure maneuver was discussed with the patient.  Near syncope  Sinus bradycardia  Dyspnea on exertion  Essential hypertension    EKG 05/16/2018: Sinus bradycardia at 56  bpm, normal axis, normal interval, no evidence of ischemia. Normal EKG.  Recommendations:   Patient's symptoms of near syncope could be related to vasovagal episodes, she has no significant conduction system abnormality on the EKG, although she has chronic bradycardia, she has never had symptoms associated with bradycardia.  Syncopal  Hence I have recommended that we continue observation only for now.  If symptoms continue to be significant and lifestyle limiting, loop recorder implantation will be very appropriate.  With regard to dyspnea on exertion and essential hypertension, blood  pressure is well controlled on the present medical therapy, although she has bradycardia, she is presently on losartan and remains asymptomatic with regard to this.  She was previously on diltiazem which was discontinued, was started on negative chronotropic agent in view of intraventricular pressure gradient noted on the echocardiogram.  She does have moderate obesity, I have discussed regarding weight loss and continued exercise on a regular basis.  I also discussed with her regarding counterpressure maneuver when she has near syncopal spells.  I will see her back in 6 months or sooner if problems.  I have advised her to record orthostatic blood pressure during episodes.  Adrian Prows, MD, [facility] 02/17/2019, 10:13 PM [facility]. [place] Pager: [phone] Office: [phone] If no answer Cell [phone]

## 2019-03-06 DIAGNOSIS — M9903 Segmental and somatic dysfunction of lumbar region: Secondary | ICD-10-CM | POA: Diagnosis not present

## 2019-03-06 DIAGNOSIS — M5441 Lumbago with sciatica, right side: Secondary | ICD-10-CM | POA: Diagnosis not present

## 2019-03-07 DIAGNOSIS — M9903 Segmental and somatic dysfunction of lumbar region: Secondary | ICD-10-CM | POA: Diagnosis not present

## 2019-03-07 DIAGNOSIS — M545 Low back pain: Secondary | ICD-10-CM | POA: Diagnosis not present

## 2019-03-07 DIAGNOSIS — M47816 Spondylosis without myelopathy or radiculopathy, lumbar region: Secondary | ICD-10-CM | POA: Diagnosis not present

## 2019-03-07 DIAGNOSIS — M5441 Lumbago with sciatica, right side: Secondary | ICD-10-CM | POA: Diagnosis not present

## 2019-03-08 DIAGNOSIS — M9903 Segmental and somatic dysfunction of lumbar region: Secondary | ICD-10-CM | POA: Diagnosis not present

## 2019-03-08 DIAGNOSIS — M5441 Lumbago with sciatica, right side: Secondary | ICD-10-CM | POA: Diagnosis not present

## 2019-03-13 DIAGNOSIS — M9903 Segmental and somatic dysfunction of lumbar region: Secondary | ICD-10-CM | POA: Diagnosis not present

## 2019-03-13 DIAGNOSIS — M5441 Lumbago with sciatica, right side: Secondary | ICD-10-CM | POA: Diagnosis not present

## 2019-03-14 DIAGNOSIS — M5441 Lumbago with sciatica, right side: Secondary | ICD-10-CM | POA: Diagnosis not present

## 2019-03-14 DIAGNOSIS — M9903 Segmental and somatic dysfunction of lumbar region: Secondary | ICD-10-CM | POA: Diagnosis not present

## 2019-03-15 DIAGNOSIS — M9903 Segmental and somatic dysfunction of lumbar region: Secondary | ICD-10-CM | POA: Diagnosis not present

## 2019-03-15 DIAGNOSIS — M5441 Lumbago with sciatica, right side: Secondary | ICD-10-CM | POA: Diagnosis not present

## 2019-03-20 DIAGNOSIS — M9903 Segmental and somatic dysfunction of lumbar region: Secondary | ICD-10-CM | POA: Diagnosis not present

## 2019-03-20 DIAGNOSIS — M5441 Lumbago with sciatica, right side: Secondary | ICD-10-CM | POA: Diagnosis not present

## 2019-03-22 DIAGNOSIS — M9903 Segmental and somatic dysfunction of lumbar region: Secondary | ICD-10-CM | POA: Diagnosis not present

## 2019-03-22 DIAGNOSIS — M5441 Lumbago with sciatica, right side: Secondary | ICD-10-CM | POA: Diagnosis not present

## 2019-03-27 DIAGNOSIS — M9903 Segmental and somatic dysfunction of lumbar region: Secondary | ICD-10-CM | POA: Diagnosis not present

## 2019-03-27 DIAGNOSIS — M5441 Lumbago with sciatica, right side: Secondary | ICD-10-CM | POA: Diagnosis not present

## 2019-04-03 DIAGNOSIS — M9903 Segmental and somatic dysfunction of lumbar region: Secondary | ICD-10-CM | POA: Diagnosis not present

## 2019-04-03 DIAGNOSIS — M5441 Lumbago with sciatica, right side: Secondary | ICD-10-CM | POA: Diagnosis not present

## 2019-04-10 DIAGNOSIS — M5441 Lumbago with sciatica, right side: Secondary | ICD-10-CM | POA: Diagnosis not present

## 2019-04-10 DIAGNOSIS — M9903 Segmental and somatic dysfunction of lumbar region: Secondary | ICD-10-CM | POA: Diagnosis not present

## 2019-04-24 DIAGNOSIS — M5441 Lumbago with sciatica, right side: Secondary | ICD-10-CM | POA: Diagnosis not present

## 2019-04-24 DIAGNOSIS — M9903 Segmental and somatic dysfunction of lumbar region: Secondary | ICD-10-CM | POA: Diagnosis not present

## 2019-05-08 DIAGNOSIS — H2513 Age-related nuclear cataract, bilateral: Secondary | ICD-10-CM | POA: Diagnosis not present

## 2019-05-08 DIAGNOSIS — H43813 Vitreous degeneration, bilateral: Secondary | ICD-10-CM | POA: Diagnosis not present

## 2019-05-08 DIAGNOSIS — D23112 Other benign neoplasm of skin of right lower eyelid, including canthus: Secondary | ICD-10-CM | POA: Diagnosis not present

## 2019-05-15 DIAGNOSIS — M1990 Unspecified osteoarthritis, unspecified site: Secondary | ICD-10-CM | POA: Diagnosis not present

## 2019-05-15 DIAGNOSIS — M0609 Rheumatoid arthritis without rheumatoid factor, multiple sites: Secondary | ICD-10-CM | POA: Diagnosis not present

## 2019-05-15 DIAGNOSIS — M79643 Pain in unspecified hand: Secondary | ICD-10-CM | POA: Diagnosis not present

## 2019-05-15 DIAGNOSIS — Z79899 Other long term (current) drug therapy: Secondary | ICD-10-CM | POA: Diagnosis not present

## 2019-05-18 DIAGNOSIS — H2511 Age-related nuclear cataract, right eye: Secondary | ICD-10-CM | POA: Diagnosis not present

## 2019-06-15 DIAGNOSIS — H2512 Age-related nuclear cataract, left eye: Secondary | ICD-10-CM | POA: Diagnosis not present

## 2019-07-03 ENCOUNTER — Other Ambulatory Visit: Payer: Self-pay | Admitting: Cardiology

## 2019-07-20 DIAGNOSIS — M0609 Rheumatoid arthritis without rheumatoid factor, multiple sites: Secondary | ICD-10-CM | POA: Diagnosis not present

## 2019-07-20 DIAGNOSIS — M1990 Unspecified osteoarthritis, unspecified site: Secondary | ICD-10-CM | POA: Diagnosis not present

## 2019-07-20 DIAGNOSIS — Z79899 Other long term (current) drug therapy: Secondary | ICD-10-CM | POA: Diagnosis not present

## 2019-07-20 DIAGNOSIS — M79643 Pain in unspecified hand: Secondary | ICD-10-CM | POA: Diagnosis not present

## 2019-07-26 DIAGNOSIS — M0609 Rheumatoid arthritis without rheumatoid factor, multiple sites: Secondary | ICD-10-CM | POA: Diagnosis not present

## 2019-08-24 NOTE — Progress Notes (Signed)
Primary Physician:  Cynthia Gravel, MD   Patient ID: Cynthia Farrell, female    DOB: [birth year], 73 y.o.   MRN: [id number]  Subjective:    Chief Complaint  Patient presents with  . Shortness of Breath  . Loss of Consciousness    HPI: Cynthia Farrell  is a 73 y.o. female  with mild hypertension, hyperdynamic LV, chronic palpitations (resolved with diltiazem), moderate obesity, history of rheumatoid arthritis and also osteo arthritis, fibromyalgia presents here for follow-up of recurrent episodes of near syncope that has been occurring over the past 2-3 years.  She's had normal coronary angiogram in 2002. Carotid duplex in 2016 was normal.   She has chronic underlying sinus bradycardia but tolerating diltazem started for hyperdynamic LV with intraventricular PG leading to dyspnea . Event monitoring has been unyielding in past.   Episodes of near syncopal and of occurred both during routine activities at home and also while she was sitting, feels disoriented and fatigue after the event. Describes as "spells" and feels like she will pass out and has mental cloudiness and comes on suddenly. 30 day event monitoring was unyielding\  She now presents for 6 month follow up. Unfortunately she has had 2 more episodes of near syncopal spells percutaneous separate 17th 2070 and on December 24, 2018 was doing routine activities in her house.  Fortunately he did not have frank syncope.   She also had NSVT during a treadmill stress test in 2002 and underwent coronary angiogram at that time that was normal. It appears that she has had repeat treadmill stress test in 2012 that showed Multifocal PVC.  Past Medical History:  Diagnosis Date  . Atrial fibrillation ([place])   . Breast cancer ([place])   . Diverticular disease   . Esophageal stricture   . Fibromyalgia   . GERD (gastroesophageal reflux disease)   . Hemorrhoids   . Hyperlipemia   . Hypertension   . Rheumatoid aortitis     Past Surgical History:   Procedure Laterality Date  . ABDOMINAL HYSTERECTOMY  1995  . APPENDECTOMY  1956  . BREAST BIOPSY  1972  . [employer] & 2005  . CATARACT EXTRACTION    . CHOLECYSTECTOMY  1999  . COLONOSCOPY  02/12/2012   Procedure: COLONOSCOPY;  Surgeon: Inda Castle, MD;  Location: [facility];  Service: Endoscopy;  Laterality: N/A;  . ESOPHAGOGASTRODUODENOSCOPY  02/12/2012   Procedure: ESOPHAGOGASTRODUODENOSCOPY (EGD);  Surgeon: Inda Castle, MD;  Location: [facility];  Service: Endoscopy;  Laterality: N/A;  . HOT HEMOSTASIS  02/12/2012   Procedure: HOT HEMOSTASIS (ARGON PLASMA COAGULATION/BICAP);  Surgeon: Inda Castle, MD;  Location: [facility];  Service: Endoscopy;  Laterality: N/A;  . MASTECTOMY  1986  . THYROID CYST EXCISION  2000  . TONSILLECTOMY  1968    Social History   Socioeconomic History  . Marital status: Married    Spouse name: Loyola Imperato.  . Number of children: 2  . Years of education: 79  . Highest education level: Not on file  Occupational History  . Occupation: Press photographer    Comment: [employer]  Social Needs  . Financial resource strain: Not on file  . Food insecurity    Worry: Not on file    Inability: Not on file  . Transportation needs    Medical: Not on file    Non-medical: Not on file  Tobacco Use  . Smoking status: Never Smoker  . Smokeless tobacco: Never Used  Substance  and Sexual Activity  . Alcohol use: No  . Drug use: No  . Sexual activity: Not on file  Lifestyle  . Physical activity    Days per week: Not on file    Minutes per session: Not on file  . Stress: Not on file  Relationships  . Social Herbalist on phone: Not on file    Gets together: Not on file    Attends religious service: Not on file    Active member of club or organization: Not on file    Attends meetings of clubs or organizations: Not on file    Relationship status: Not on file  . Intimate partner violence    Fear of current or ex  partner: Not on file    Emotionally abused: Not on file    Physically abused: Not on file    Forced sexual activity: Not on file  Other Topics Concern  . Not on file  Social History Narrative   Lives at home with husband.   Caffeine use: Drinks decaf tea   Coffee very rarely    Review of Systems  Constitution: Negative for decreased appetite, malaise/fatigue, weight gain and weight loss.  Eyes: Negative for visual disturbance.  Cardiovascular: Positive for dyspnea on exertion (chronic) and near-syncope. Negative for chest pain, claudication, leg swelling, orthopnea, palpitations and syncope.  Respiratory: Negative for hemoptysis and wheezing.   Endocrine: Negative for cold intolerance and heat intolerance.  Hematologic/Lymphatic: Does not bruise/bleed easily.  Skin: Negative for nail changes.  Musculoskeletal: Positive for joint pain. Negative for muscle weakness and myalgias.  Gastrointestinal: Negative for abdominal pain, change in bowel habit, nausea and vomiting.  Neurological: Negative for difficulty with concentration, dizziness, focal weakness and headaches.  Psychiatric/Behavioral: Negative for altered mental status and suicidal ideas.  All other systems reviewed and are negative.  Objective:   Vitals with BMI 08/25/2019 02/17/2019 01/03/2019  Height 5\' 2"  5\' 2"  5\' 2"   Weight 186 lbs 6 oz 189 lbs 10 oz 190 lbs  BMI 34.08 0000000 [id number]  Systolic - Q000111Q A999333  Diastolic - 88 81  Pulse - 60 48    08/25/19 2:15 PM  08/25/19 2:27 PM  08/25/19 2:28 PM      Orthostatic BP  130/59  130/62  140/76   BP Location  LeftArm  LeftArm  LeftArm   Patient Position  Supine  Sitting  Standing   Cuff Size  Normal  Normal  Normal   Orthostatic Pulse  70  74  76   Temp  97.41F(36.3C)     SpO2  96%  96%  96%    Physical Exam  Constitutional: She is oriented to person, place, and time. Vital signs are normal. She appears well-developed and well-nourished.  Moderately obese   HENT:  Head: Normocephalic and atraumatic.  Neck: Normal range of motion.  Cardiovascular: Normal rate, regular rhythm and intact distal pulses.  Murmur heard.  Early systolic murmur is present with a grade of 2/6 at the upper right sternal border and apex. Pulses:      Carotid pulses are on the right side with bruit and on the left side with bruit. Pulmonary/Chest: Effort normal and breath sounds normal. No accessory muscle usage. No respiratory distress.  Abdominal: Soft. Bowel sounds are normal.  Musculoskeletal: Normal range of motion.  Neurological: She is alert and oriented to person, place, and time.  Skin: Skin is warm and dry.  Vitals reviewed.  Radiology: No results found.  Laboratory examination:    PRN Meds:. Medications Discontinued During This Encounter  Medication Reason  . predniSONE (DELTASONE) 5 MG tablet Error   Current Meds  Medication Sig  . amoxicillin (AMOXIL) 500 MG capsule Take 500 mg by mouth. Take 4 capsules prior to having teeth cleaned or dental procedure  . aspirin EC 81 MG tablet Take 81 mg by mouth daily.  . Biotin 1000 MCG tablet Take 1,000 mcg by mouth daily.  . cyanocobalamin 2000 MCG tablet Take 500 mcg by mouth daily.   Marland Kitchen diltiazem (CARDIZEM SR) 90 MG 12 hr capsule TAKE 1 CAPSULE(90 MG) BY MOUTH TWICE DAILY  . ezetimibe (ZETIA) 10 MG tablet Take 10 mg by mouth daily.  . folic acid (FOLVITE) Q000111Q MCG tablet Take 800 mcg by mouth daily.  Marland Kitchen HYDROcodone-acetaminophen (VICODIN ES) 7.5-750 MG per tablet Take 1 tablet by mouth every 6 (six) hours as needed.  . leflunomide (ARAVA) 20 MG tablet Take 20 mg by mouth daily.  Marland Kitchen losartan (COZAAR) 25 MG tablet Take 25 mg by mouth daily.  Marland Kitchen omeprazole-sodium bicarbonate (ZEGERID) 40-1100 MG per capsule Take 1 capsule by mouth daily.   Marland Kitchen sulfaSALAzine (AZULFIDINE) 500 MG tablet Take 500 mg by mouth 2 (two) times daily.  . traMADol (ULTRAM) 50 MG tablet Take 50 mg by mouth every 12 (twelve) hours as needed.  As directed  . zolpidem (AMBIEN) 10 MG tablet Take 5 mg by mouth once. 1/2-1 tab    Cardiac Studies:   Cardiac cath 2002: Hyperdynamic LV and normal coronary arteries: Done for chest pain and exercise treadmill stress exercise-induced nonsustained ventricular tachycardia. Catherization 1994 and 1995 Normal  30 day event monitor 07/27/2018-08/25/2018: 2 patient triggered events with reported symptoms related with normal sinus rhythm with first-degree block. Lowest bradycardiac episode occurred on day 19 (11/24) at 10:31 AM with HR at 44 bpm that patient stated she did have symptoms of near syncope during that time. No high degree AV block, A. fib, or SVT noted.  Echocardiogram 06/13/2018: Left ventricle cavity is normal in size. Normal global wall motion. Doppler evidence of grade I (impaired) diastolic dysfunction, normal LAP. Calculated EF 66%. Left atrial cavity is mildly dilated at 4 cm and 52 mL. Structurally normal trileaflet aortic valve with no regurgitation noted. There is mild pressure gradient across the AV with mean of 10 mm Hg and peak 18 mm Hg, AVA 1.99 cm. Compared to the study done on 03/23/2017, no significant change.  Carotid artery duplex 06/13/2018: No hemodynamically significant arterial disease in the internal carotid artery bilaterally. No significant plaque noted as well. Antegrade right vertebral artery flow. Antegrade left vertebral artery flow. Compared to the study done on 02/06/2015, no significant change. Incidental right thyroid nodule noted. Consider dedicated scan.  Assessment:     ICD-10-CM   1. Near syncope  R55   2. Dyspnea on exertion  R06.00   3. Essential hypertension  I10 EKG 12-Lead     EKG 08/25/2019: Normal sinus rhythm at rate of 65 bpm, normal axis.  Incomplete right bundle branch block.  Normal EKG. No significant change from  EKG 05/16/2018: Sinus bradycardia at 56 bpm, normal axis, normal interval, no evidence of ischemia. Normal EKG.   Recommendations:   IJEOMA CRISANTO  is a 73 y.o. Caucasian female  with mild hypertension, hyperdynamic LV, chronic palpitations (resolved with diltiazem used also to reduce HR due to intraventricular PG), moderate obesity, history of rheumatoid arthritis and also osteo arthritis, fibromyalgia presents here for  follow-up of recurrent episodes of near syncope that has been occurring over the past 2-3 years.   Patient's symptoms of near syncope could be related to vasovagal episodes, she has no significant conduction system abnormality on the EKG, although she has chronic bradycardia, she has never had symptoms associated with bradycardia.  Fortunately she has had 2 more episodes of near syncopal spells percutaneous separate 17th 2070 and on December 24, 2018 was doing routine activities in her house.  Fortunately she did not have frank syncope.   BP is controlled, she is not orthostatic today.  Weight loss was discussed again with the patient.   Dyspnea has remained stable.  I discussed with her regarding options of repeat event monitoring which has not been diagnostic in the past as her episodes are widespread and unexpected.  Hence we discussed options of loop recorder implantation, risks, benefits, discussed in detail including infection, minor trauma and scarring.  Patient is willing to proceed.  Office visit after loop implantation.   Adrian Prows, MD, [facility] 08/25/2019, 2:51 PM [facility]. Early Pager: [phone] Office: [phone] If no answer Cell [phone]

## 2019-08-25 ENCOUNTER — Encounter: Payer: Self-pay | Admitting: Cardiology

## 2019-08-25 ENCOUNTER — Ambulatory Visit (INDEPENDENT_AMBULATORY_CARE_PROVIDER_SITE_OTHER): Payer: Medicare Other | Admitting: Cardiology

## 2019-08-25 ENCOUNTER — Other Ambulatory Visit: Payer: Self-pay

## 2019-08-25 VITALS — Temp 97.3°F | Ht 62.0 in | Wt 186.4 lb

## 2019-08-25 DIAGNOSIS — R55 Syncope and collapse: Secondary | ICD-10-CM | POA: Diagnosis not present

## 2019-08-25 DIAGNOSIS — I10 Essential (primary) hypertension: Secondary | ICD-10-CM

## 2019-08-25 DIAGNOSIS — R0600 Dyspnea, unspecified: Secondary | ICD-10-CM

## 2019-08-25 DIAGNOSIS — R0609 Other forms of dyspnea: Secondary | ICD-10-CM

## 2019-09-11 ENCOUNTER — Other Ambulatory Visit: Payer: Self-pay | Admitting: Internal Medicine

## 2019-09-11 DIAGNOSIS — R42 Dizziness and giddiness: Secondary | ICD-10-CM

## 2019-09-23 ENCOUNTER — Other Ambulatory Visit: Payer: Medicare Other

## 2019-09-26 DIAGNOSIS — I10 Essential (primary) hypertension: Secondary | ICD-10-CM | POA: Diagnosis not present

## 2019-09-26 DIAGNOSIS — D649 Anemia, unspecified: Secondary | ICD-10-CM | POA: Diagnosis not present

## 2019-09-26 DIAGNOSIS — E538 Deficiency of other specified B group vitamins: Secondary | ICD-10-CM | POA: Diagnosis not present

## 2019-09-26 DIAGNOSIS — Z0000 Encounter for general adult medical examination without abnormal findings: Secondary | ICD-10-CM | POA: Diagnosis not present

## 2019-09-26 DIAGNOSIS — E7800 Pure hypercholesterolemia, unspecified: Secondary | ICD-10-CM | POA: Diagnosis not present

## 2019-10-01 ENCOUNTER — Other Ambulatory Visit: Payer: Self-pay | Admitting: Cardiology

## 2019-10-03 DIAGNOSIS — F419 Anxiety disorder, unspecified: Secondary | ICD-10-CM | POA: Diagnosis not present

## 2019-10-03 DIAGNOSIS — R42 Dizziness and giddiness: Secondary | ICD-10-CM | POA: Diagnosis not present

## 2019-10-03 DIAGNOSIS — Z0000 Encounter for general adult medical examination without abnormal findings: Secondary | ICD-10-CM | POA: Diagnosis not present

## 2019-10-03 DIAGNOSIS — R251 Tremor, unspecified: Secondary | ICD-10-CM | POA: Diagnosis not present

## 2019-10-12 ENCOUNTER — Ambulatory Visit: Payer: Medicare Other | Admitting: Cardiology

## 2019-10-19 DIAGNOSIS — M1990 Unspecified osteoarthritis, unspecified site: Secondary | ICD-10-CM | POA: Diagnosis not present

## 2019-10-19 DIAGNOSIS — Z79899 Other long term (current) drug therapy: Secondary | ICD-10-CM | POA: Diagnosis not present

## 2019-10-19 DIAGNOSIS — M79643 Pain in unspecified hand: Secondary | ICD-10-CM | POA: Diagnosis not present

## 2019-10-19 DIAGNOSIS — M0609 Rheumatoid arthritis without rheumatoid factor, multiple sites: Secondary | ICD-10-CM | POA: Diagnosis not present

## 2019-10-20 ENCOUNTER — Ambulatory Visit
Admission: RE | Admit: 2019-10-20 | Discharge: 2019-10-20 | Disposition: A | Discharge status: Home / Self Care | Payer: Medicare Other | Source: Ambulatory Visit | Attending: Internal Medicine | Admitting: Internal Medicine

## 2019-10-20 ENCOUNTER — Other Ambulatory Visit: Payer: Self-pay

## 2019-10-20 DIAGNOSIS — R42 Dizziness and giddiness: Secondary | ICD-10-CM | POA: Diagnosis not present

## 2019-10-23 ENCOUNTER — Ambulatory Visit: Payer: Medicare Other | Admitting: Cardiology

## 2019-11-15 ENCOUNTER — Other Ambulatory Visit: Payer: Self-pay

## 2019-11-15 ENCOUNTER — Ambulatory Visit: Payer: Medicare Other | Admitting: Neurology

## 2019-11-15 ENCOUNTER — Encounter: Payer: Self-pay | Admitting: Neurology

## 2019-11-15 VITALS — BP 159/87 | HR 74 | Temp 97.9°F | Ht 62.0 in | Wt 190.0 lb

## 2019-11-15 DIAGNOSIS — R430 Anosmia: Secondary | ICD-10-CM | POA: Diagnosis not present

## 2019-11-15 DIAGNOSIS — R2689 Other abnormalities of gait and mobility: Secondary | ICD-10-CM

## 2019-11-15 DIAGNOSIS — R269 Unspecified abnormalities of gait and mobility: Secondary | ICD-10-CM | POA: Diagnosis not present

## 2019-11-15 DIAGNOSIS — R251 Tremor, unspecified: Secondary | ICD-10-CM | POA: Diagnosis not present

## 2019-11-15 DIAGNOSIS — Z1152 Encounter for screening for COVID-19: Secondary | ICD-10-CM

## 2019-11-15 NOTE — Addendum Note (Signed)
Addended by: Sarina Ill B on: 11/15/2019 02:25 PM   Modules accepted: Orders

## 2019-11-15 NOTE — Patient Instructions (Addendum)
DAT Scan for parkinson's disease  Brain DaTscan How to prepare and what to expect ?????????????????????????????????????What is a brain DaTscan? A brain DaTscan is a nuclear medicine scan. It uses radioactive material to diagnose some diseases of the brain, especially those that cause tremor (shakiness). DaTscan is a brand name for a drug called ioflupane I-123. A brain DaTscan is a form of radiology, because radiation is used to take pictures of the body. This radioactive drug is ordered especially for you. Because of this, we need at least 72 hours' notice if you must cancel or reschedule your scan.   How does the scan work? You will be given a small dose of tracer (radioactive material) through an intravenous (IV) line. This tracer will collect in part of your brain and give off gamma rays. A special camera called a gamma camera will use these rays to produce pictures and measurements of your brain. How do I prepare? Some drugs will affect the results of your brain DaTscan. You will need to stop taking these drugs before your scan. The table on page 2 lists the drugs that need to be stopped, and for how many days before your scan. This list is in alphabetical order by the generic name of the drug. The common brand names are listed beneath the generic name. Please confirm these instructions with your doctor who prescribed the drug. Drugs to Stop Taking Before your scan, stop taking these medicines for the length of time shown: Name of Drug Stop Taking  Amoxapine 4 days before  Benztropine  Cogentin 3 days before  Bupropion (Aplenzin, Budeprion, Voxra, Wellbutrin, Zyban) 48 hours before  Buspirone 15 hours before  Citalopram 24 hours before  Cocaine 6 hours before  Escitalopram 24 hours before  Methamphetamine 24 hours before  Methylphenidate (Concerta, Metadate, Methylin, Ritalin) 20 hours before  Paroxetine 24 hours before  Selegilene 48 hours before  Sertraline 3 days before  If  you are breastfeeding, or if there is any chance you are pregnant, please tell the scheduler or technologist (the person who will help you prepare for your scan). How is the scan done? When you first arrive, we will ask you to drink a small cup of water with potassium iodine in it. This water may have a metallic taste.  An hour after you drink the potassium iodine water, the technologist will inject a small amount of tracer into a vein in your arm or hand through your IV.  You must stay in the department for 30 minutes after the injection.  You will then have a break for 3 hours. It is OK to eat and drink during this break.  You must return to the clinic after this 3-hour break to have images of your brain taken.  Then, 4 hours after you receive your tracer injection, the technologist will take images of your brain with the gamma camera. You will lie flat on the exam table while these images are being taken.  You must not move while the camera is taking pictures. If you move, the pictures will be blurry and may have to be taken again.  Taking the images will take 40 to 45 minutes. Your total time in the imaging room will be about 1 hour.  You may also have a low-dose CT scan of your brain to help confirm any results. A CT scan is another way to take images inside your body.  It will take about 5 hours from the time you drink the potassium  iodine water until the scans are complete. What will I feel during the scan? The technologist will help make you as comfortable as possible on the exam table for the scan.  You may feel some minor discomfort from the IV.  Lying still on the exam table may be hard for some patients.  The camera will be close to your head. This may make you feel confined or uneasy (claustrophobic). Please tell the doctor who referred you for this scan if you know you are claustrophobic. Are there any side effects from the scan? Most of the radioactivity from the tracer will pass  out of your body in your urine or stool. The rest simply goes away over time.  Bad reactions to this scan are very rare. Fewer than 1% of patients (fewer than 1 out of 100) have a bad reaction. Reactions may include headache, nausea, vertigo (dizziness), or dry mouth. How do I get the results? When the test is over, the nuclear medicine doctor will review your images, prepare a written report, and talk with your doctor about the results. Your doctor will then talk with you about the results and your treatment options. If you needed to stop taking any medicines on the day of your scan, ask your doctor when to start taking them again.  The potentially interfering drugs consist of: amoxapine, amphetamine, benztropine, bupropion, buspirone, citalopram, cocaine, mazindol, methamphetamine, methylphenidate, norephedrine, phentermine, escitalopram, phenylpropanolamine, selegiline, paroxetine, and sertraline    Essential Tremor A tremor is trembling or shaking that a person cannot control. Most tremors affect the hands or arms. Tremors can also affect the head, vocal cords, legs, and other parts of the body. Essential tremor is a tremor without a known cause. Usually, it occurs while a person is trying to perform an action. It tends to get worse gradually as a person ages. What are the causes? The cause of this condition is not known. What increases the risk? You are more likely to develop this condition if:  You have a family member with essential tremor.  You are age 38 or older.  You take certain medicines. What are the signs or symptoms? The main sign of a tremor is a rhythmic shaking of certain parts of your body that is uncontrolled and unintentional. You may:  Have difficulty eating with a spoon or fork.  Have difficulty writing.  Nod your head up and down or side to side.  Have a quivering voice. The shaking may:  Get worse over time.  Come and go.  Be more noticeable on one side  of your body.  Get worse due to stress, fatigue, caffeine, and extreme heat or cold. How is this diagnosed? This condition may be diagnosed based on:  Your symptoms and medical history.  A physical exam. There is no single test to diagnose an essential tremor. However, your health care provider may order tests to rule out other causes of your condition. These may include:  Blood and urine tests.  Imaging studies of your brain, such as CT scan and MRI.  A test that measures involuntary muscle movement (electromyogram). How is this treated? Treatment for essential tremor depends on the severity of the condition.  Some tremors may go away without treatment.  Mild tremors may not need treatment if they do not affect your day-to-day life.  Severe tremors may need to be treated using one or more of the following options: ? Medicines. ? Lifestyle changes. ? Occupational or physical therapy. Follow these  instructions at home: Lifestyle   Do not use any products that contain nicotine or tobacco, such as cigarettes and e-cigarettes. If you need help quitting, ask your health care provider.  Limit your caffeine intake as told by your health care provider.  Try to get 8 hours of sleep each night.  Find ways to manage your stress that fits your lifestyle and personality. Consider trying meditation or yoga.  Try to anticipate stressful situations and allow extra time to manage them.  If you are struggling emotionally with the effects of your tremor, consider working with a mental health provider. General instructions  Take over-the-counter and prescription medicines only as told by your health care provider.  Avoid extreme heat and extreme cold.  Keep all follow-up visits as told by your health care provider. This is important. Visits may include physical therapy visits. Contact a health care provider if:  You experience any changes in the location or intensity of your  tremors.  You start having a tremor after starting a new medicine.  You have tremor with other symptoms, such as: ? Numbness. ? Tingling. ? Pain. ? Weakness.  Your tremor gets worse.  Your tremor interferes with your daily life.  You feel down, blue, or sad for at least 2 weeks in a row.  Worrying about your tremor and what other people think about you interferes with your everyday life functions, including relationships, work, or school. Summary  Essential tremor is a tremor without a known cause. Usually, it occurs when you are trying to perform an action.  The cause of this condition is not known.  The main sign of a tremor is a rhythmic shaking of certain parts of your body that is uncontrolled and unintentional.  Treatment for essential tremor depends on the severity of the condition. This information is not intended to replace advice given to you by your health care provider. Make sure you discuss any questions you have with your health care provider. Document Revised: 09/17/2017 Document Reviewed: 09/17/2017 Elsevier Patient Education  [place] [facility] Parkinson's disease causes problems with movements. It is a long-term condition. It gets worse over time (is progressive). It affects each person in different ways. It makes it harder for you to:  Control how your body moves.  Move your body normally. The condition can range from mild to very bad (advanced). What are the causes? This condition results from a loss of brain cells called neurons. These brain cells make a chemical called dopamine, which is needed to control body movement. As the condition gets worse, the brain cells make less dopamine. This makes it hard to move or control your movements. The exact cause of this condition is not known. What increases the risk?  Being female.  Being age 73 or older.  Having family members who had Parkinson's disease.  Having had an injury to the  brain.  Being very sad (depressed).  Being around things that are harmful or poisonous. What are the signs or symptoms? Symptoms of this condition can vary. The main symptoms have to do with movement. These include:  A tremor or shaking while you are resting that you cannot control.  Stiffness in your neck, arms, and legs.  Slowing of movement. This may include: ? Losing expressions of the face. ? Having trouble making small movements that are needed to button your clothing or brush your teeth.  Walking in a way that is not normal. You may walk with  short, shuffling steps.  Loss of balance when standing. You may sway, fall backward, or have trouble making turns. Other symptoms include:  Being very sad, worried, or confused.  Seeing or hearing things that are not real.  Losing thinking abilities (dementia).  Trouble speaking or swallowing.  Having a hard time pooping (constipation).  Needing to pee right away, peeing often, or not being able to control when you pee or poop.  Sleep problems. How is this treated? There is no cure. The goal of treatment is to manage your symptoms. Treatment may include:  Medicines.  Therapy to help with talking or movement.  Surgery to reduce shaking and other movements that you cannot control. Follow these instructions at home: Medicines  Take over-the-counter and prescription medicines only as told by your doctor.  Avoid taking pain or sleeping medicines. Eating and drinking  Follow instructions from your doctor about what you cannot eat or drink.  Do not drink alcohol. Activity  Talk with your doctor about if it is safe for you to drive.  Do exercises as told by your doctor. Lifestyle      Put in grab bars and railings in your home. These help to prevent falls.  Do not use any products that contain nicotine or tobacco, such as cigarettes, e-cigarettes, and chewing tobacco. If you need help quitting, ask your  doctor.  Join a support group. General instructions  Talk with your doctor about what you need help with and what your safety needs are.  Keep all follow-up visits as told by your doctor, including any therapy visits to help with talking or moving. This is important. Contact a doctor if:  Medicines do not help your symptoms.  You feel off-balance.  You fall at home.  You need more help at home.  You have trouble swallowing.  You have a very hard time pooping.  You have a lot of side effects from your medicines.  You feel very sad, worried, or confused. Get help right away if:  You were hurt in a fall.  You see or hear things that are not real.  You cannot swallow without choking.  You have chest pain or trouble breathing.  You do not feel safe at home.  You have thoughts about hurting yourself or others. If you ever feel like you may hurt yourself or others, or have thoughts about taking your own life, get help right away. You can go to your nearest emergency department or call:  Your local emergency services (911 in the [place]  A suicide crisis helpline, such as the [facility] at 9072273361. This is open 24 hours a day. Summary  This condition causes problems with movements.  It is a long-term condition. It gets worse over time.  There is no cure. Treatment focuses on managing your symptoms.  Talk with your doctor about what you need help with and what your safety needs are.  Keep all follow-up visits as told by your doctor. This is important. This information is not intended to replace advice given to you by your health care provider. Make sure you discuss any questions you have with your health care provider. Document Revised: 11/24/2018 Document Reviewed: 11/24/2018 Elsevier Patient Education  [place].

## 2019-11-15 NOTE — Progress Notes (Addendum)
[id number]    Provider:  Dr Cynthia Farrell Requesting Provider: Jani Gravel, MD Primary Care Provider:  Jani Gravel, MD  CC:  tremor  HPI:  Cynthia Farrell is a 74 y.o. female here as requested by Cynthia Gravel, MD for tremor.  I saw patient in 2016 for dizziness and work-up neurologically was unremarkable.  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  I reviewed Cynthia. Julianne Farrell notes, patient referred here for tremor, labs collected September 26, 2019 include CBC which is unremarkable, BUN 20, creatinine 1.03, CMP unremarkable, B12 1634, I do not see any history on tremor in his notes.  Started 2 years ago, noticed unsteady, pictures when she takes them are blurred. Not at rest but with action like trying to pour things. Worse when she is active, working in the yard, can be moreso when she is tired. About a year ago a family membr noticed. Slowly worsening. Maternal uncle with parkinson's disease, her paternal first cousin with parkinson's disease. Started in the right hand and now in te left as not as bad. She has back issues and she is slowing down which may be due to the back issues. No shuffling. Loss of smell and taste. The foods she used to taste she can;t taste it about a year. No recent falls. No drooling, she has a runny nose especially in the fall she relates to allergies,    Reviewed notes, labs and imaging from outside physicians, which showed :  Personally reviewed MRI brain 09/2019 images and agree with the following: FINDINGS: Brain: There is no acute infarction or  intracranial hemorrhage. There is no intracranial mass, mass effect, or edema. There is no hydrocephalus or extra-axial fluid collection. Ventricles and sulci are normal in size and configuration. Patchy T2 hyperintensity in the supratentorial white matter is nonspecific but may reflect mild chronic microvascular ischemic changes.  Vascular: Major vessel flow voids at the skull base are preserved.  Skull and upper cervical spine: Normal marrow signal is preserved.  Sinuses/Orbits: Mild mucosal thickening. Bilateral lens replacements.  Other: Sella is unremarkable.  Left mastoid effusion.  IMPRESSION: No acute infarction, hemorrhage, or mass.  Nonspecific left mastoid effusion.  Review of Systems: Patient complains of symptoms per HPI as well as the following symptoms: tremor, back pain, joint pain. Pertinent negatives and positives per HPI. All others negative.   Social History   Socioeconomic History  . Marital status: Married    Spouse name: Cynthia Farrell.  . Number of children: 2  . Years of education: 45  . Highest education level: Not on file  Occupational History  . Occupation: Sales    Comment: [employer]  Tobacco Use  . Smoking status: Never Smoker  . Smokeless tobacco: Never Used  Substance and Sexual Activity  . Alcohol use: No  . Drug use: No  . Sexual activity: Not on file  Other Topics Concern  . Not on file  Social History Narrative   Lives at home with husband.   Caffeine use: Drinks decaf tea   Right handed   Social Determinants of Health   Financial Resource Strain:   . Difficulty of Paying Living Expenses: Not on file  Food  Insecurity:   . Worried About Charity fundraiser in the Last Year: Not on file  . Ran Out of Food in the Last Year: Not on file  Transportation Needs:   . Lack of Transportation (Medical): Not on file  . Lack of Transportation (Non-Medical): Not on file  Physical Activity:   . Days of Exercise per Week: Not  on file  . Minutes of Exercise per Session: Not on file  Stress:   . Feeling of Stress : Not on file  Social Connections:   . Frequency of Communication with Friends and Family: Not on file  . Frequency of Social Gatherings with Friends and Family: Not on file  . Attends Religious Services: Not on file  . Active Member of Clubs or Organizations: Not on file  . Attends Archivist Meetings: Not on file  . Marital Status: Not on file  Intimate Partner Violence:   . Fear of Current or Ex-Partner: Not on file  . Emotionally Abused: Not on file  . Physically Abused: Not on file  . Sexually Abused: Not on file    Family History  Problem Relation Age of Onset  . Diabetes Mother   . Heart disease Mother   . Heart disease Father   . Heart attack Father   . Kidney disease Maternal Grandmother   . Liver disease Maternal Grandfather   . Heart disease Paternal Grandmother   . Breast cancer Paternal Grandmother   . Heart disease Paternal Grandfather   . Heart disease Brother   . Heart disease Brother   . Parkinson's disease Cousin        father's side  . Parkinson's disease Maternal Uncle   . Colon cancer Neg Hx     Past Medical History:  Diagnosis Date  . Atrial fibrillation ([place])   . Breast cancer ([place])   . Diverticular disease   . Esophageal stricture   . Fibromyalgia   . GERD (gastroesophageal reflux disease)   . Hemorrhoids   . Hyperlipemia   . Hypertension   . Osteoarthritis   . Rheumatoid aortitis     Patient Active Problem List   Diagnosis Date Noted  . Tremor 11/15/2019  . Chronic venous insufficiency 05/15/2016  . Lymphedema 05/15/2016  . Pulsatile tinnitus of right ear 01/16/2015  . Right-sided headache 01/16/2015  . Vertigo 01/16/2015  . 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 Pre-syncope 01/16/2015  . Dizziness and giddiness 01/16/2015  . Word finding difficulty 01/16/2015  . Altered awareness, transient 01/16/2015  . Rheumatoid  arthritis(714.0) 03/28/2012  . Blood in stool 02/12/2012  . Acute posthemorrhagic anemia 02/12/2012  . Acute pyloric channel ulcer 02/12/2012  . Gastric AVM 02/12/2012  . Stricture and stenosis of esophagus 02/12/2012  . Fibromyalgia 02/11/2012  . Aortic insufficiency and aortic stenosis 02/11/2012  . Hx of breast cancer 02/11/2012  . Hx of adenomatous colonic polyps 02/11/2012  . GERD with stricture 02/11/2012    Past Surgical History:  Procedure Laterality Date  . ABDOMINAL HYSTERECTOMY  1995  . APPENDECTOMY  1956  . BREAST BIOPSY  1972  . BREAST LUMPECTOMY Left   . [place] & 2005  . CATARACT EXTRACTION    . CHOLECYSTECTOMY  1999  . COLONOSCOPY  02/12/2012   Procedure: COLONOSCOPY;  Surgeon: Inda Castle, MD;  Location: [facility];  Service: Endoscopy;  Laterality: N/A;  . DILATION AND CURETTAGE OF UTERUS    . ESOPHAGOGASTRODUODENOSCOPY  02/12/2012   Procedure: ESOPHAGOGASTRODUODENOSCOPY (  EGD);  Surgeon: Inda Castle, MD;  Location: [facility];  Service: Endoscopy;  Laterality: N/A;  . HOT HEMOSTASIS  02/12/2012   Procedure: HOT HEMOSTASIS (ARGON PLASMA COAGULATION/BICAP);  Surgeon: Inda Castle, MD;  Location: [facility];  Service: Endoscopy;  Laterality: N/A;  . MASTECTOMY  1986  . THYROID CYST EXCISION  2000  . TONSILLECTOMY  1968    Current Outpatient Medications  Medication Sig Dispense Refill  . amoxicillin (AMOXIL) 500 MG capsule Take 500 mg by mouth. Take 4 capsules prior to having teeth cleaned or dental procedure    . aspirin EC 81 MG tablet Take 81 mg by mouth daily.    . Biotin 1000 MCG tablet Take 1,000 mcg by mouth daily.    . cyanocobalamin 2000 MCG tablet Take 500 mcg by mouth daily.     Marland Kitchen diltiazem (CARDIZEM SR) 90 MG 12 hr capsule TAKE 1 CAPSULE(90 MG) BY MOUTH TWICE DAILY 180 capsule 1  . ezetimibe (ZETIA) 10 MG tablet Take 10 mg by mouth daily.    . fluvastatin (LESCOL) 20 MG capsule Take 20 mg by mouth daily.    .  folic acid (FOLVITE) Q000111Q MCG tablet Take 800 mcg by mouth daily.    Marland Kitchen HYDROcodone-acetaminophen (VICODIN ES) 7.5-750 MG per tablet Take 1 tablet by mouth every 6 (six) hours as needed.    . leflunomide (ARAVA) 20 MG tablet Take 20 mg by mouth daily.    Marland Kitchen losartan (COZAAR) 25 MG tablet Take 25 mg by mouth daily.    Marland Kitchen omeprazole-sodium bicarbonate (ZEGERID) 40-1100 MG per capsule Take 1 capsule by mouth daily.     Marland Kitchen sulfaSALAzine (AZULFIDINE) 500 MG tablet Take 500 mg by mouth 2 (two) times daily.    . traMADol (ULTRAM) 50 MG tablet Take 50 mg by mouth every 12 (twelve) hours as needed. As directed    . zolpidem (AMBIEN) 10 MG tablet Take 5 mg by mouth once. 1/2-1 tab     No current facility-administered medications for this visit.    Allergies as of 11/15/2019 - Review Complete 11/15/2019  Allergen Reaction Noted  . Prednisone Nausea Only 02/12/2012    Vitals: BP (!) 159/87 (BP Location: Left Arm, Patient Position: Sitting)   Pulse 74   Temp 97.9 F (36.6 C) Comment: taken at front  Ht 5\' 2"  (1.575 m)   Wt 190 lb (86.2 kg)   BMI 34.75 kg/m  Last Weight:  Wt Readings from Last 1 Encounters:  11/15/19 190 lb (86.2 kg)   Last Height:   Ht Readings from Last 1 Encounters:  11/15/19 5\' 2"  (1.575 m)     Physical exam: Exam: Gen: NAD, conversant, well nourised, obese, well groomed                     CV: RRR, no MRG. No Carotid Bruits. No peripheral edema, warm, nontender Eyes: Conjunctivae clear without exudates or hemorrhage  Neuro: Detailed Neurologic Exam  Speech:    Speech is normal; fluent and spontaneous with normal comprehension.  Cognition:    The patient is oriented to person, place, and time;     recent and remote memory intact;     language fluent;     normal attention, concentration,     fund of knowledge Cranial Nerves:    The pupils are equal, round, pin point and sluggish(recent cataract removal) Could not visualize fundi due to small pupils. Visual  fields are full to finger confrontation. Extraocular movements  are intact. Trigeminal sensation is intact and the muscles of mastication are normal. The face is symmetric. The palate elevates in the midline. Hearing intact. Voice is normal. Shoulder shrug is normal. The tongue has normal motion without fasciculations.   Coordination:    No dysmetria, difficulty on the right with finger taps  Gait:    Heel-toe and tandem gait are normal. SOme slight imbalance with tandem. Not shuffling. Decreased right arm swing.  Motor Observation:    No asymmetry, no atrophy. Right > left postural and action tremor Tone:    Normal muscle tone.    Posture:    Posture is normal. normal erect    Strength: Mild prox LE symmetrical weakness otherwise strength is V/V in the upper and lower limbs.      Sensation: intact to LT     Reflex Exam:  DTR's:    Deep tendon reflexes in the upper and lower extremities are normal bilaterally.   Toes:    The toes are downgoing bilaterally.   Clonus:    Clonus is absent.    Assessment/Plan:  Patient with postural and action tremor nit at rest. However she has family history of parkinson;s disease, she has hit her partner in bed while dreaming (REM sleep disorder?), she has lost smell and taste, decreased arm swing, concerning for early Parkinson's Disease(PD). Need a DAT scan to differentiate between essential tremor and PD. MRI brain normal. No coffee intake, no meds on list that could cause tremor.  We will wait and see what happens with DAT Scan before follow up scheduled Orders Placed This Encounter  Procedures  . NM BRAIN DATSCAN TUMOR LOC INFLAM SPECT 1 DAY  . TSH  . Basic Metabolic Panel  . SAR CoV2 Serology (COVID 19)AB(IGG)IA   Cc: Cynthia Gravel, MD,    Sarina Ill, MD  [facility] [address] [place], [place] [address]  Phone [phone] Fax [phone]

## 2019-11-16 LAB — BASIC METABOLIC PANEL
BUN/Creatinine Ratio: 20 (ref 12–28)
BUN: 26 mg/dL (ref 8–27)
CO2: 23 mmol/L (ref 20–29)
Calcium: 10 mg/dL (ref 8.7–10.3)
Chloride: 104 mmol/L (ref 96–106)
Creatinine, Ser: 1.31 mg/dL — ABNORMAL HIGH (ref 0.57–1.00)
GFR calc Af Amer: 47 mL/min/{1.73_m2} — ABNORMAL LOW (ref >59)
GFR calc non Af Amer: 40 mL/min/{1.73_m2} — ABNORMAL LOW (ref >59)
Glucose: 82 mg/dL (ref 65–99)
Potassium: 5.4 mmol/L — ABNORMAL HIGH (ref 3.5–5.2)
Sodium: 143 mmol/L (ref 134–144)

## 2019-11-16 LAB — TSH: TSH: 2.37 u[IU]/mL (ref 0.450–4.500)

## 2019-11-16 LAB — SAR COV2 SEROLOGY (COVID19)AB(IGG),IA: DiaSorin SARS-CoV-2 Ab, IgG: NEGATIVE

## 2019-11-22 ENCOUNTER — Telehealth: Payer: Self-pay | Admitting: *Deleted

## 2019-11-22 NOTE — Telephone Encounter (Signed)
-----   Message from Melvenia Beam, MD sent at 11/16/2019  6:22 PM EST ----- She has some elevated creatinine 1.31(does she have CKD?). tsh normal. Covid antibodies negative. thanks

## 2019-11-22 NOTE — Telephone Encounter (Signed)
Spoke with pt and discussed lab results. Pt is unaware of any kidney issues or disease. She will follow-up with PCP. She is aware I have faxed over the labs to PCP. Pt currently waiting on patient form for dat scan from [place].

## 2019-11-23 NOTE — Telephone Encounter (Signed)
Noted Thanks Bethany  

## 2019-12-13 NOTE — Telephone Encounter (Signed)
Patient has decided not to do DAT scan at this time she wants to have a follow up with Dr. Jaynee Eagles

## 2019-12-14 DIAGNOSIS — M79643 Pain in unspecified hand: Secondary | ICD-10-CM | POA: Diagnosis not present

## 2019-12-14 DIAGNOSIS — M0609 Rheumatoid arthritis without rheumatoid factor, multiple sites: Secondary | ICD-10-CM | POA: Diagnosis not present

## 2019-12-14 DIAGNOSIS — M1990 Unspecified osteoarthritis, unspecified site: Secondary | ICD-10-CM | POA: Diagnosis not present

## 2019-12-14 DIAGNOSIS — Z79899 Other long term (current) drug therapy: Secondary | ICD-10-CM | POA: Diagnosis not present

## 2019-12-14 NOTE — Telephone Encounter (Signed)
I just saw her about a month ago. If she wants to follow up I would schedule her for 3-4 months from now. If she wants an appointment sooner I would ask her why and if she has any particular questions since I just saw her a few weeks ago.

## 2019-12-14 NOTE — Telephone Encounter (Signed)
Called pt on home # and LVM (ok per DPR) letting her know I was returning her call to discuss. Asked for a call back to discuss on Monday during office hours.

## 2019-12-21 NOTE — Telephone Encounter (Signed)
Spoke with pt and discussed. Pt stated she was given an option at the office visit to either proceed with DAT scan or wait and f/u in a few months and then discuss again. Pt had thought she wanted to do the scan but now wants to wait and f/u later. I scheduled pt for Thurs 5/27 @ 1:30 pm arrival 15-30 minutes early. Pt was very appreciative.

## 2019-12-25 DIAGNOSIS — E559 Vitamin D deficiency, unspecified: Secondary | ICD-10-CM | POA: Diagnosis not present

## 2019-12-25 DIAGNOSIS — E785 Hyperlipidemia, unspecified: Secondary | ICD-10-CM | POA: Diagnosis not present

## 2020-01-01 DIAGNOSIS — I10 Essential (primary) hypertension: Secondary | ICD-10-CM | POA: Diagnosis not present

## 2020-01-01 DIAGNOSIS — I890 Lymphedema, not elsewhere classified: Secondary | ICD-10-CM | POA: Diagnosis not present

## 2020-01-01 DIAGNOSIS — M0609 Rheumatoid arthritis without rheumatoid factor, multiple sites: Secondary | ICD-10-CM | POA: Diagnosis not present

## 2020-01-01 DIAGNOSIS — M797 Fibromyalgia: Secondary | ICD-10-CM | POA: Diagnosis not present

## 2020-01-15 DIAGNOSIS — H43813 Vitreous degeneration, bilateral: Secondary | ICD-10-CM | POA: Diagnosis not present

## 2020-01-15 DIAGNOSIS — Z961 Presence of intraocular lens: Secondary | ICD-10-CM | POA: Diagnosis not present

## 2020-02-06 ENCOUNTER — Other Ambulatory Visit: Payer: Self-pay

## 2020-02-06 ENCOUNTER — Ambulatory Visit (HOSPITAL_COMMUNITY)
Admission: EM | Admit: 2020-02-06 | Discharge: 2020-02-06 | Disposition: A | Discharge status: Home / Self Care | Payer: Medicare Other | Attending: Family Medicine | Admitting: Family Medicine

## 2020-02-06 ENCOUNTER — Encounter (HOSPITAL_COMMUNITY): Payer: Self-pay

## 2020-02-06 DIAGNOSIS — M7989 Other specified soft tissue disorders: Secondary | ICD-10-CM

## 2020-02-06 DIAGNOSIS — L237 Allergic contact dermatitis due to plants, except food: Secondary | ICD-10-CM

## 2020-02-06 MED ORDER — PREDNISONE 10 MG PO TABS: 20.0000 mg | ORAL_TABLET | Freq: Every day | ORAL | 0 refills | Status: AC

## 2020-02-06 NOTE — ED Provider Notes (Signed)
[facility]    CSN: [id number] Arrival date & time: 02/06/20  1345      History   Chief Complaint Chief Complaint  Patient presents with  . arm swelling    HPI Cynthia Farrell is a 74 y.o. female.   Patient is a 74 year old female past medical history of A. fib, breast cancer, diverticular disease, esophageal stricture, fibromyalgia, GERD, hemorrhoids, hyperlipidemia, hypertension, osteoarthritis, rheumatoid arthritis.  She presents today with right forearm swelling.  This started on Saturday.  Prior to this on Friday she was out in her yard and possibly got into some poison ivy.  Generalized patchy erythematous rash to the arm.  The arm is mildly itchy and painful mostly in the anterior aspect.  No vesicles or drainage.  She has been doing calamine lotion spray.  No fever, chills, body aches or night sweats.  She does have some mild lymphedema in the arm from mastectomy.  ROS per HPI      Past Medical History:  Diagnosis Date  . Atrial fibrillation ([place])   . Breast cancer ([place])   . Diverticular disease   . Esophageal stricture   . Fibromyalgia   . GERD (gastroesophageal reflux disease)   . Hemorrhoids   . Hyperlipemia   . Hypertension   . Osteoarthritis   . Rheumatoid aortitis     Patient Active Problem List   Diagnosis Date Noted  . Tremor 11/15/2019  . Chronic venous insufficiency 05/15/2016  . Lymphedema 05/15/2016  . Pulsatile tinnitus of right ear 01/16/2015  . Right-sided headache 01/16/2015  . Vertigo 01/16/2015  . 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 Pre-syncope 01/16/2015  . Dizziness and giddiness 01/16/2015  . Word finding difficulty 01/16/2015  . Altered awareness, transient 01/16/2015  . Rheumatoid arthritis(714.0) 03/28/2012  . Blood in stool 02/12/2012  . Acute posthemorrhagic anemia 02/12/2012  . Acute pyloric channel ulcer 02/12/2012  . Gastric AVM 02/12/2012  . Stricture and stenosis of esophagus 02/12/2012  .  Fibromyalgia 02/11/2012  . Aortic insufficiency and aortic stenosis 02/11/2012  . Hx of breast cancer 02/11/2012  . Hx of adenomatous colonic polyps 02/11/2012  . GERD with stricture 02/11/2012    Past Surgical History:  Procedure Laterality Date  . ABDOMINAL HYSTERECTOMY  1995  . APPENDECTOMY  1956  . BREAST BIOPSY  1972  . BREAST LUMPECTOMY Left   . [place] & 2005  . CATARACT EXTRACTION    . CHOLECYSTECTOMY  1999  . COLONOSCOPY  02/12/2012   Procedure: COLONOSCOPY;  Surgeon: Inda Castle, MD;  [facility]: [facility];  Service: Endoscopy;  Laterality: N/A;  . DILATION AND CURETTAGE OF UTERUS    . ESOPHAGOGASTRODUODENOSCOPY  02/12/2012   Procedure: ESOPHAGOGASTRODUODENOSCOPY (EGD);  Surgeon: Inda Castle, MD;  Location: [facility];  Service: Endoscopy;  Laterality: N/A;  . HOT HEMOSTASIS  02/12/2012   Procedure: HOT HEMOSTASIS (ARGON PLASMA COAGULATION/BICAP);  Surgeon: Inda Castle, MD;  Location: [facility];  Service: Endoscopy;  Laterality: N/A;  . MASTECTOMY  1986  . THYROID CYST EXCISION  2000  . TONSILLECTOMY  1968    OB History   No obstetric history on file.      Home Medications    Prior to Admission medications   Medication Sig Start Date End Date Taking? Authorizing Provider  amoxicillin (AMOXIL) 500 MG capsule Take 500 mg by mouth. Take 4 capsules prior to having teeth cleaned or dental procedure    [provider]  aspirin EC  81 MG tablet Take 81 mg by mouth daily.    [provider]  Biotin 1000 MCG tablet Take 1,000 mcg by mouth daily.    [provider]  cyanocobalamin 2000 MCG tablet Take 500 mcg by mouth daily.     [provider]  diltiazem (CARDIZEM SR) 90 MG 12 hr capsule TAKE 1 CAPSULE(90 MG) BY MOUTH TWICE DAILY 10/02/19   Adrian Prows, MD  ezetimibe (ZETIA) 10 MG tablet Take 10 mg by mouth daily. 12/13/18   [provider]  fluvastatin (LESCOL) 20 MG capsule Take 20 mg by mouth  daily. 11/14/19   [provider]  folic acid (FOLVITE) Q000111Q MCG tablet Take 800 mcg by mouth daily.    [provider]  HYDROcodone-acetaminophen (VICODIN ES) 7.5-750 MG per tablet Take 1 tablet by mouth every 6 (six) hours as needed.    [provider]  leflunomide (ARAVA) 20 MG tablet Take 20 mg by mouth daily. 12/15/18   [provider]  losartan (COZAAR) 25 MG tablet Take 25 mg by mouth daily.    [provider]  omeprazole-sodium bicarbonate (ZEGERID) 40-1100 MG per capsule Take 1 capsule by mouth daily.     [provider]  predniSONE (DELTASONE) 10 MG tablet Take 2 tablets (20 mg total) by mouth daily for 5 days. 02/06/20 02/11/20  Loura Halt A, NP  sulfaSALAzine (AZULFIDINE) 500 MG tablet Take 500 mg by mouth 2 (two) times daily.    [provider]  traMADol (ULTRAM) 50 MG tablet Take 50 mg by mouth every 12 (twelve) hours as needed. As directed 02/18/12   [provider]  zolpidem (AMBIEN) 10 MG tablet Take 5 mg by mouth once. 1/2-1 tab    [provider]    Family History Family History  Problem Relation Age of Onset  . Diabetes Mother   . Heart disease Mother   . Heart disease Father   . Heart attack Father   . Kidney disease Maternal Grandmother   . Liver disease Maternal Grandfather   . Heart disease Paternal Grandmother   . Breast cancer Paternal Grandmother   . Heart disease Paternal Grandfather   . Heart disease Brother   . Heart disease Brother   . Parkinson's disease Cousin        father's side  . Parkinson's disease Maternal Uncle   . Colon cancer Neg Hx     Social History Social History   Tobacco Use  . Smoking status: Never Smoker  . Smokeless tobacco: Never Used  Substance Use Topics  . Alcohol use: No  . Drug use: No     Allergies   Prednisone   Review of Systems Review of Systems   Physical Exam Triage Vital Signs ED Triage Vitals  Enc Vitals Group     BP  02/06/20 1446 (!) 149/78     Pulse Rate 02/06/20 1446 69     Resp 02/06/20 1446 18     Temp 02/06/20 1446 99.8 F (37.7 C)     Temp Source 02/06/20 1446 Oral     SpO2 02/06/20 1446 98 %     Weight 02/06/20 1444 190 lb (86.2 kg)     Height --      Head Circumference --      Peak Flow --      Pain Score 02/06/20 1444 3     Pain Loc --      Pain Edu? --      Excl.  in [place]? --    No data found.  Updated Vital Signs BP (!) 149/78 (BP Location: Right Arm)   Pulse 69   Temp 99.8 F (37.7 C) (Oral)   Resp 18   Wt 190 lb (86.2 kg)   SpO2 98%   BMI 34.75 kg/m   Visual Acuity Right Eye Distance:   Left Eye Distance:   Bilateral Distance:    Right Eye Near:   Left Eye Near:    Bilateral Near:     Physical Exam Vitals and nursing note reviewed.  Constitutional:      General: She is not in acute distress.    Appearance: Normal appearance. She is not ill-appearing, toxic-appearing or diaphoretic.  HENT:     Head: Normocephalic.     Nose: Nose normal.  Eyes:     Conjunctiva/sclera: Conjunctivae normal.  Pulmonary:     Effort: Pulmonary effort is normal.  Musculoskeletal:        General: Normal range of motion.     Cervical back: Normal range of motion.     Comments: Moderate swelling to right forearm with patchy areas of papules and erythema.  Mild increased warmth to the extremity.  2+ radial pulse. No drainage  Skin:    General: Skin is warm and dry.     Findings: No rash.  Neurological:     Mental Status: She is alert.  Psychiatric:        Mood and Affect: Mood normal.      UC Treatments / Results  Labs (all labs ordered are listed, but only abnormal results are displayed) Labs Reviewed - No data to display  EKG   Radiology No results found.  Procedures Procedures (including critical care time)  Medications Ordered in UC Medications - No data to display  Initial Impression / Assessment and Plan / UC Course  I have reviewed the triage vital signs  and the nursing notes.  Pertinent labs & imaging results that were available during my care of the patient were reviewed by me and considered in my medical decision making (see chart for details).     Poison ivy dermatitis Most likely the cause of the inflammatory reaction and swelling of the arm. We will have her do cool compresses or ice pack to the area Treating with prednisone oral for 5 days, low-dose. Not terribly concerned for cellulitis at this time We will have her do close watch of the arm and follow-up for any continued or worsening problems Final Clinical Impressions(s) / UC Diagnoses   Final diagnoses:  Poison ivy dermatitis  Swelling of arm     Discharge Instructions     Treating for possible poison ivy/oak reaction Treating with prednisone Take medication as prescribed with food. You can do cool compresses or ice pack to the area Follow up as needed for continued or worsening symptoms     ED Prescriptions    Medication Sig Dispense Auth. Provider   predniSONE (DELTASONE) 10 MG tablet Take 2 tablets (20 mg total) by mouth daily for 5 days. 10 tablet Loura Halt A, NP     PDMP not reviewed this encounter.   Loura Halt A, NP 02/07/20 0840

## 2020-02-06 NOTE — Discharge Instructions (Signed)
Treating for possible poison ivy/oak reaction Treating with prednisone Take medication as prescribed with food. You can do cool compresses or ice pack to the area Follow up as needed for continued or worsening symptoms

## 2020-02-06 NOTE — ED Triage Notes (Signed)
Pt states she has right arm swelling this started on Saturday. Pt states it a little warm to the touch and a tender.

## 2020-02-10 ENCOUNTER — Emergency Department (HOSPITAL_COMMUNITY)
Admission: EM | Admit: 2020-02-10 | Discharge: 2020-02-10 | Disposition: A | Discharge status: Home / Self Care | Payer: Medicare Other | Attending: Emergency Medicine | Admitting: Emergency Medicine

## 2020-02-10 ENCOUNTER — Encounter (HOSPITAL_COMMUNITY): Payer: Self-pay | Admitting: Emergency Medicine

## 2020-02-10 DIAGNOSIS — Z853 Personal history of malignant neoplasm of breast: Secondary | ICD-10-CM | POA: Diagnosis not present

## 2020-02-10 DIAGNOSIS — Z7982 Long term (current) use of aspirin: Secondary | ICD-10-CM | POA: Insufficient documentation

## 2020-02-10 DIAGNOSIS — Z79899 Other long term (current) drug therapy: Secondary | ICD-10-CM | POA: Diagnosis not present

## 2020-02-10 DIAGNOSIS — I10 Essential (primary) hypertension: Secondary | ICD-10-CM | POA: Diagnosis not present

## 2020-02-10 DIAGNOSIS — M545 Low back pain, unspecified: Secondary | ICD-10-CM

## 2020-02-10 DIAGNOSIS — M069 Rheumatoid arthritis, unspecified: Secondary | ICD-10-CM | POA: Insufficient documentation

## 2020-02-10 DIAGNOSIS — Z9012 Acquired absence of left breast and nipple: Secondary | ICD-10-CM | POA: Insufficient documentation

## 2020-02-10 LAB — CBC WITH DIFFERENTIAL/PLATELET
Abs Immature Granulocytes: 0.03 10*3/uL (ref 0.00–0.07)
Basophils Absolute: 0.1 10*3/uL (ref 0.0–0.1)
Basophils Relative: 1 %
Eosinophils Absolute: 0.3 10*3/uL (ref 0.0–0.5)
Eosinophils Relative: 4 %
HCT: 39.3 % (ref 36.0–46.0)
Hemoglobin: 12 g/dL (ref 12.0–15.0)
Immature Granulocytes: 0 %
Lymphocytes Relative: 34 %
Lymphs Abs: 3.1 10*3/uL (ref 0.7–4.0)
MCH: 30.8 pg (ref 26.0–34.0)
MCHC: 30.5 g/dL (ref 30.0–36.0)
MCV: 100.8 fL — ABNORMAL HIGH (ref 80.0–100.0)
Monocytes Absolute: 1.1 10*3/uL — ABNORMAL HIGH (ref 0.1–1.0)
Monocytes Relative: 13 %
Neutro Abs: 4.5 10*3/uL (ref 1.7–7.7)
Neutrophils Relative %: 48 %
Platelets: 319 10*3/uL (ref 150–400)
RBC: 3.9 MIL/uL (ref 3.87–5.11)
RDW: 13.9 % (ref 11.5–15.5)
WBC: 9.1 10*3/uL (ref 4.0–10.5)
nRBC: 0 % (ref 0.0–0.2)

## 2020-02-10 LAB — URINALYSIS, ROUTINE W REFLEX MICROSCOPIC
Bilirubin Urine: NEGATIVE
Glucose, UA: NEGATIVE mg/dL
Hgb urine dipstick: NEGATIVE
Ketones, ur: NEGATIVE mg/dL
Leukocytes,Ua: NEGATIVE
Nitrite: NEGATIVE
Protein, ur: NEGATIVE mg/dL
Specific Gravity, Urine: 1.019 (ref 1.005–1.030)
pH: 5 (ref 5.0–8.0)

## 2020-02-10 LAB — BASIC METABOLIC PANEL
Anion gap: 8 (ref 5–15)
BUN: 26 mg/dL — ABNORMAL HIGH (ref 8–23)
CO2: 29 mmol/L (ref 22–32)
Calcium: 8.8 mg/dL — ABNORMAL LOW (ref 8.9–10.3)
Chloride: 104 mmol/L (ref 98–111)
Creatinine, Ser: 1.37 mg/dL — ABNORMAL HIGH (ref 0.44–1.00)
GFR calc Af Amer: 44 mL/min — ABNORMAL LOW (ref >60)
GFR calc non Af Amer: 38 mL/min — ABNORMAL LOW (ref >60)
Glucose, Bld: 106 mg/dL — ABNORMAL HIGH (ref 70–99)
Potassium: 4.6 mmol/L (ref 3.5–5.1)
Sodium: 141 mmol/L (ref 135–145)

## 2020-02-10 MED ORDER — LIDOCAINE 5 % EX PTCH: 1.0000 | MEDICATED_PATCH | CUTANEOUS | 0 refills | Status: DC

## 2020-02-10 MED ORDER — KETOROLAC TROMETHAMINE 15 MG/ML IJ SOLN
15.0000 mg | Freq: Once | INTRAMUSCULAR | Status: DC
  Filled 2020-02-10: qty 1

## 2020-02-10 MED ORDER — LIDOCAINE 5 % EX PTCH
1.0000 | MEDICATED_PATCH | Freq: Once | CUTANEOUS | Status: DC
  Administered 2020-02-10: 1 via TRANSDERMAL
  Filled 2020-02-10: qty 1

## 2020-02-10 MED ORDER — KETOROLAC TROMETHAMINE 15 MG/ML IJ SOLN
15.0000 mg | Freq: Once | INTRAMUSCULAR | Status: AC
  Administered 2020-02-10: 15 mg via INTRAMUSCULAR

## 2020-02-10 NOTE — ED Triage Notes (Signed)
Patient in POV, presents with lower back pain - chronic in nature - states history of bulging disc. Was to see PCP on Monday but felt she could not wait due to severity of pain. Took pain medicine/muscle relaxer PTA. Ambulatory. VSS.

## 2020-02-10 NOTE — Discharge Instructions (Addendum)
Please pick up lidocaine patches and use as prescribed along with your regular pain medication and muscle relaxers. Continue taking your prednisone as prescribed.   Follow up with your PCP regarding your ED visit today  Return to the ED IMMEDIATELY for any worsening symptoms including worsening pain, radiation of pain down your legs, inability to feel your legs or your groin area, weakness in your legs, holding onto urine, peeing or pooping on yourself, or any other new/concerning symptoms.

## 2020-02-10 NOTE — ED Notes (Signed)
Patient ambulatory to bathroom with steady gait.

## 2020-02-10 NOTE — ED Notes (Signed)
Verbalized understanding of DC instructions, Rx, follow up care 

## 2020-02-10 NOTE — ED Provider Notes (Signed)
[facility] EMERGENCY DEPARTMENT Provider Note   CSN: [id number] Arrival date & time: 02/10/20  1512     History Chief Complaint  Patient presents with  . Back Pain    Cynthia Farrell is a 74 y.o. female with PMHx A fib, breast cancer, GERD, fibromyalgia, HLD, HTN, RA, OA, chronic back pain s/2 herniated disc L4-L5 who presents to the ED today with complaint of acute on chronic left lower back pain that worsened last night. Pt reports that she was using the riding lawn mower yesterday when she felt a twinge in her back. She reports she was unable to finish the rest of the lawn work including weed eating. Pt reports she took her normal dose of 7.5 mg Percocet and 500 mg Flexeril last night without relief. Husband reports pt was in tears all night. She took the same medications about 1-2 hours PTA to the ED today. She states when she gets worsening back pain she will typically go to her doctor who will manipulate her back. She also reports hx of injections into her back about 1 year ago. Pt is currently on Prednisone for a poison ivy rash. Pt denies fevers, chills, urinary retention, urinary or bowel incontinence, saddle anesthesia, or any other associated symptoms.   The history is provided by the patient and medical records.       Past Medical History:  Diagnosis Date  . Atrial fibrillation ([place])   . Breast cancer ([place])   . Diverticular disease   . Esophageal stricture   . Fibromyalgia   . GERD (gastroesophageal reflux disease)   . Hemorrhoids   . Hyperlipemia   . Hypertension   . Osteoarthritis   . Rheumatoid aortitis     Patient Active Problem List   Diagnosis Date Noted  . Tremor 11/15/2019  . Chronic venous insufficiency 05/15/2016  . Lymphedema 05/15/2016  . Pulsatile tinnitus of right ear 01/16/2015  . Right-sided headache 01/16/2015  . Vertigo 01/16/2015  . 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 Pre-syncope 01/16/2015  . Dizziness and  giddiness 01/16/2015  . Word finding difficulty 01/16/2015  . Altered awareness, transient 01/16/2015  . Rheumatoid arthritis(714.0) 03/28/2012  . Blood in stool 02/12/2012  . Acute posthemorrhagic anemia 02/12/2012  . Acute pyloric channel ulcer 02/12/2012  . Gastric AVM 02/12/2012  . Stricture and stenosis of esophagus 02/12/2012  . Fibromyalgia 02/11/2012  . Aortic insufficiency and aortic stenosis 02/11/2012  . Hx of breast cancer 02/11/2012  . Hx of adenomatous colonic polyps 02/11/2012  . GERD with stricture 02/11/2012    Past Surgical History:  Procedure Laterality Date  . ABDOMINAL HYSTERECTOMY  1995  . APPENDECTOMY  1956  . BREAST BIOPSY  1972  . BREAST LUMPECTOMY Left   . [place] & 2005  . CATARACT EXTRACTION    . CHOLECYSTECTOMY  1999  . COLONOSCOPY  02/12/2012   Procedure: COLONOSCOPY;  Surgeon: Inda Castle, MD;  Location: [facility] ENDOSCOPY;  Service: Endoscopy;  Laterality: N/A;  . DILATION AND CURETTAGE OF UTERUS    . ESOPHAGOGASTRODUODENOSCOPY  02/12/2012   Procedure: ESOPHAGOGASTRODUODENOSCOPY (EGD);  Surgeon: Inda Castle, MD;  Location: [facility];  Service: Endoscopy;  Laterality: N/A;  . HOT HEMOSTASIS  02/12/2012   Procedure: HOT HEMOSTASIS (ARGON PLASMA COAGULATION/BICAP);  Surgeon: Inda Castle, MD;  Location: [facility];  Service: Endoscopy;  Laterality: N/A;  . MASTECTOMY  1986  . THYROID CYST EXCISION  2000  . TONSILLECTOMY  1968  OB History   No obstetric history on file.     Family History  Problem Relation Age of Onset  . Diabetes Mother   . Heart disease Mother   . Heart disease Father   . Heart attack Father   . Kidney disease Maternal Grandmother   . Liver disease Maternal Grandfather   . Heart disease Paternal Grandmother   . Breast cancer Paternal Grandmother   . Heart disease Paternal Grandfather   . Heart disease Brother   . Heart disease Brother   . Parkinson's disease Cousin        father's  side  . Parkinson's disease Maternal Uncle   . Colon cancer Neg Hx     Social History   Tobacco Use  . Smoking status: Never Smoker  . Smokeless tobacco: Never Used  Substance Use Topics  . Alcohol use: No  . Drug use: No    Home Medications Prior to Admission medications   Medication Sig Start Date End Date Taking? Authorizing Provider  amoxicillin (AMOXIL) 500 MG capsule Take 500 mg by mouth. Take 4 capsules prior to having teeth cleaned or dental procedure    [provider]  aspirin EC 81 MG tablet Take 81 mg by mouth daily.    [provider]  Biotin 1000 MCG tablet Take 1,000 mcg by mouth daily.    [provider]  cyanocobalamin 2000 MCG tablet Take 500 mcg by mouth daily.     [provider]  diltiazem (CARDIZEM SR) 90 MG 12 hr capsule TAKE 1 CAPSULE(90 MG) BY MOUTH TWICE DAILY 10/02/19   Adrian Prows, MD  ezetimibe (ZETIA) 10 MG tablet Take 10 mg by mouth daily. 12/13/18   [provider]  fluvastatin (LESCOL) 20 MG capsule Take 20 mg by mouth daily. 11/14/19   [provider]  folic acid (FOLVITE) Q000111Q MCG tablet Take 800 mcg by mouth daily.    [provider]  HYDROcodone-acetaminophen (VICODIN ES) 7.5-750 MG per tablet Take 1 tablet by mouth every 6 (six) hours as needed.    [provider]  leflunomide (ARAVA) 20 MG tablet Take 20 mg by mouth daily. 12/15/18   [provider]  lidocaine (LIDODERM) 5 % Place 1 patch onto the skin daily. Remove & Discard patch within 12 hours or as directed by MD 02/10/20   Eustaquio Maize, PA-C  losartan (COZAAR) 25 MG tablet Take 25 mg by mouth daily.    [provider]  omeprazole-sodium bicarbonate (ZEGERID) 40-1100 MG per capsule Take 1 capsule by mouth daily.     [provider]  predniSONE (DELTASONE) 10 MG tablet Take 2 tablets (20 mg total) by mouth daily for 5 days. 02/06/20 02/11/20  Loura Halt A, NP  sulfaSALAzine (AZULFIDINE) 500 MG tablet  Take 500 mg by mouth 2 (two) times daily.    [provider]  traMADol (ULTRAM) 50 MG tablet Take 50 mg by mouth every 12 (twelve) hours as needed. As directed 02/18/12   [provider]  zolpidem (AMBIEN) 10 MG tablet Take 5 mg by mouth once. 1/2-1 tab    [provider]    Allergies    Prednisone  Review of Systems   Review of Systems  Constitutional: Negative for chills and fever.  Genitourinary: Negative for difficulty urinating.  Musculoskeletal: Positive for back pain.  All other systems reviewed and are negative.   Physical Exam Updated Vital Signs BP (!) 144/63   Pulse 61   Temp 97.6  F (36.4 C) (Oral)   Resp 15   Ht 5\' 2"  (1.575 m)   Wt 86.2 kg   SpO2 99%   BMI 34.75 kg/m   Physical Exam Vitals and nursing note reviewed.  Constitutional:      Appearance: She is not ill-appearing or diaphoretic.  HENT:     Head: Normocephalic and atraumatic.  Eyes:     Conjunctiva/sclera: Conjunctivae normal.  Cardiovascular:     Rate and Rhythm: Normal rate and regular rhythm.     Pulses: Normal pulses.  Pulmonary:     Effort: Pulmonary effort is normal.     Breath sounds: Normal breath sounds. No wheezing, rhonchi or rales.  Abdominal:     Palpations: Abdomen is soft.     Tenderness: There is no abdominal tenderness. There is no guarding or rebound.  Musculoskeletal:     Cervical back: Neck supple.     Comments: No C, T, or L midline spinal TTP. + Left lumbarspinal musculature TTP. ROM intact to neck and back. Strength 4/5 to BLEs. Sensation intact to dull and sharp touch. Negative SLR bilaterally. 2+ DP pulses. 2+ DTRs.   Skin:    General: Skin is warm and dry.  Neurological:     Mental Status: She is alert.     ED Results / Procedures / Treatments   Labs (all labs ordered are listed, but only abnormal results are displayed) Labs Reviewed  BASIC METABOLIC PANEL - Abnormal; Notable for the following components:      Result Value    Glucose, Bld 106 (*)    BUN 26 (*)    Creatinine, Ser 1.37 (*)    Calcium 8.8 (*)    GFR calc non Af Amer 38 (*)    GFR calc Af Amer 44 (*)    All other components within normal limits  CBC WITH DIFFERENTIAL/PLATELET - Abnormal; Notable for the following components:   MCV 100.8 (*)    Monocytes Absolute 1.1 (*)    All other components within normal limits  URINALYSIS, ROUTINE W REFLEX MICROSCOPIC    EKG None  Radiology No results found.  Procedures Procedures (including critical care time)  Medications Ordered in ED Medications  lidocaine (LIDODERM) 5 % 1 patch (1 patch Transdermal Patch Applied 02/10/20 1603)  ketorolac (TORADOL) 15 MG/ML injection 15 mg (15 mg Intramuscular Given 02/10/20 1721)    ED Course  I have reviewed the triage vital signs and the nursing notes.  Pertinent labs & imaging results that were available during my care of the patient were reviewed by me and considered in my medical decision making (see chart for details).    MDM Rules/Calculators/A&P                      74 year old female presents to the ED today complaining of acute on chronic left lower back pain that worsened yesterday after riding lawnmower.  7.5 mg Percocet as well as Flexeril at home without relief.  She could not make it to wait until Monday when her chiropractor opens up.  Rival to the ED patient is afebrile, nontachycardic nontachypneic.  She appears to be in no acute distress.  She has no midline spinal tenderness.  She has left paralumbar spinal tenderness to palpation.  Radiation down legs.  Negative straight leg raise bilaterally.  No red flag symptoms concerning for cauda equina, spinal epidural abscess, AAA.  Will obtain lab work at this time to assess kidney function prior to  medications.  Will apply lidocaine patch.  Do not feel patient needs anything stronger narcotic wise given she already takes 7.5 mg Percocet at home.   Labs reassuring. Lidoderm patch and small dose of  toradol given. On reassessment pt reports improvement in her symptoms and is ready to go home. Feel she is stable for discharge at this time. Will discharge with lidocaine patches. Pt already had percocet, flexeril, and prednisone at home. Have encouraged close PCP follow up next week. Strict return precautions discussed. Pt is in agreement with plan and stable for discharge home.   This note was prepared using Dragon voice recognition software and may include unintentional dictation errors due to the inherent limitations of voice recognition software.  Final Clinical Impression(s) / ED Diagnoses Final diagnoses:  Acute left-sided low back pain without sciatica    Rx / DC Orders ED Discharge Orders         Ordered    lidocaine (LIDODERM) 5 %  Every 24 hours     02/10/20 1828           Discharge Instructions     Please pick up lidocaine patches and use as prescribed along with your regular pain medication and muscle relaxers. Continue taking your prednisone as prescribed.   Follow up with your PCP regarding your ED visit today  Return to the ED IMMEDIATELY for any worsening symptoms including worsening pain, radiation of pain down your legs, inability to feel your legs or your groin area, weakness in your legs, holding onto urine, peeing or pooping on yourself, or any other new/concerning symptoms.        Eustaquio Maize, PA-C 02/10/20 1831    Pattricia Boss, MD 02/12/20 [phone]

## 2020-02-12 DIAGNOSIS — M5136 Other intervertebral disc degeneration, lumbar region: Secondary | ICD-10-CM | POA: Diagnosis not present

## 2020-02-12 DIAGNOSIS — M545 Low back pain: Secondary | ICD-10-CM | POA: Diagnosis not present

## 2020-02-12 DIAGNOSIS — M4726 Other spondylosis with radiculopathy, lumbar region: Secondary | ICD-10-CM | POA: Diagnosis not present

## 2020-02-12 DIAGNOSIS — M9903 Segmental and somatic dysfunction of lumbar region: Secondary | ICD-10-CM | POA: Diagnosis not present

## 2020-02-12 DIAGNOSIS — M797 Fibromyalgia: Secondary | ICD-10-CM | POA: Diagnosis not present

## 2020-02-12 DIAGNOSIS — M0609 Rheumatoid arthritis without rheumatoid factor, multiple sites: Secondary | ICD-10-CM | POA: Diagnosis not present

## 2020-02-14 ENCOUNTER — Other Ambulatory Visit: Payer: Self-pay

## 2020-02-14 ENCOUNTER — Encounter (HOSPITAL_COMMUNITY): Payer: Self-pay | Admitting: *Deleted

## 2020-02-14 ENCOUNTER — Emergency Department (HOSPITAL_COMMUNITY)
Admission: EM | Admit: 2020-02-14 | Discharge: 2020-02-15 | Disposition: A | Discharge status: Home / Self Care | Payer: Medicare Other | Attending: Emergency Medicine | Admitting: Emergency Medicine

## 2020-02-14 DIAGNOSIS — I4891 Unspecified atrial fibrillation: Secondary | ICD-10-CM | POA: Insufficient documentation

## 2020-02-14 DIAGNOSIS — Z79899 Other long term (current) drug therapy: Secondary | ICD-10-CM | POA: Insufficient documentation

## 2020-02-14 DIAGNOSIS — Z7982 Long term (current) use of aspirin: Secondary | ICD-10-CM | POA: Insufficient documentation

## 2020-02-14 DIAGNOSIS — I10 Essential (primary) hypertension: Secondary | ICD-10-CM | POA: Diagnosis not present

## 2020-02-14 DIAGNOSIS — Z853 Personal history of malignant neoplasm of breast: Secondary | ICD-10-CM | POA: Diagnosis not present

## 2020-02-14 DIAGNOSIS — M545 Low back pain: Secondary | ICD-10-CM | POA: Diagnosis not present

## 2020-02-14 DIAGNOSIS — M069 Rheumatoid arthritis, unspecified: Secondary | ICD-10-CM | POA: Insufficient documentation

## 2020-02-14 DIAGNOSIS — M5489 Other dorsalgia: Secondary | ICD-10-CM | POA: Diagnosis not present

## 2020-02-14 DIAGNOSIS — M5416 Radiculopathy, lumbar region: Secondary | ICD-10-CM | POA: Insufficient documentation

## 2020-02-14 DIAGNOSIS — R52 Pain, unspecified: Secondary | ICD-10-CM | POA: Diagnosis not present

## 2020-02-14 NOTE — ED Triage Notes (Signed)
Pt arrived via [facility] with lower back pain since Friday and seen in ED on Saturday.  Pt saw Chiropractor on Monday and now is here because she cannot get up and walk with out pain.  She denies incontinence of bowel or bladder. Pt currently on stretcher in triage

## 2020-02-14 NOTE — ED Notes (Signed)
Call [phone] family to come back when roomed. Cynthia Farrell

## 2020-02-15 ENCOUNTER — Ambulatory Visit: Payer: Self-pay | Admitting: Neurology

## 2020-02-15 ENCOUNTER — Emergency Department (HOSPITAL_COMMUNITY): Payer: Medicare Other

## 2020-02-15 DIAGNOSIS — M5416 Radiculopathy, lumbar region: Secondary | ICD-10-CM | POA: Diagnosis not present

## 2020-02-15 DIAGNOSIS — M545 Low back pain: Secondary | ICD-10-CM | POA: Diagnosis not present

## 2020-02-15 LAB — URINALYSIS, ROUTINE W REFLEX MICROSCOPIC
Bilirubin Urine: NEGATIVE
Glucose, UA: NEGATIVE mg/dL
Hgb urine dipstick: NEGATIVE
Ketones, ur: NEGATIVE mg/dL
Leukocytes,Ua: NEGATIVE
Nitrite: NEGATIVE
Protein, ur: NEGATIVE mg/dL
Specific Gravity, Urine: 1.017 (ref 1.005–1.030)
pH: 6 (ref 5.0–8.0)

## 2020-02-15 MED ORDER — DEXAMETHASONE SODIUM PHOSPHATE 10 MG/ML IJ SOLN
10.0000 mg | Freq: Once | INTRAMUSCULAR | Status: AC
  Administered 2020-02-15: 10 mg via INTRAMUSCULAR
  Filled 2020-02-15: qty 1

## 2020-02-15 MED ORDER — OXYCODONE-ACETAMINOPHEN 5-325 MG PO TABS
1.0000 | ORAL_TABLET | Freq: Once | ORAL | Status: AC
  Administered 2020-02-15: 1 via ORAL
  Filled 2020-02-15: qty 1

## 2020-02-15 NOTE — ED Provider Notes (Signed)
[facility] EMERGENCY DEPARTMENT Provider Note   CSN: [id number] Arrival date & time: 02/14/20  1251     History Chief Complaint  Patient presents with  . Back Pain    Cynthia Farrell is a 74 y.o. female.  HPI     This is a 74 year old female with a history of atrial fibrillation, fibromyalgia, hypertension, osteoarthritis who presents with recurrent back pain.  Patient reports she has a chronic history of back pain.  However, ever since last Thursday she has had worsening lower back pain.  She was seen and evaluated in ER on Saturday.  She has been taking Flexeril, hydrocodone, and lidocaine patches.  She was also given prednisone.  She states that these have not really helped.  She reports ongoing pain and discomfort particularly with standing and sitting.  She reports the pain is in the middle of her lower back and radiates into her left hip.  She saw her primary physician, orthopedics and her chiropractor.  She reports getting to x-rays obtained that were largely unremarkable.  She reports "I got an injection at the orthopedist" and she is scheduled for repeat evaluation and possible second injection next week.  Denies any fevers or history of cancer.  No numbness, tingling, bowel or bladder difficulty.  She currently rates her pain at 3 out of 10 but states that if she sits up or stands it is excruciating and it is inhibiting her ability to ambulate and tolerate her ADLs.  She reports she has been in bed most of the weekend.  Denies any urinary symptoms.  Past Medical History:  Diagnosis Date  . Atrial fibrillation ([place])   . Breast cancer ([place])   . Diverticular disease   . Esophageal stricture   . Fibromyalgia   . GERD (gastroesophageal reflux disease)   . Hemorrhoids   . Hyperlipemia   . Hypertension   . Osteoarthritis   . Rheumatoid aortitis     Patient Active Problem List   Diagnosis Date Noted  . Tremor 11/15/2019  . Chronic venous insufficiency  05/15/2016  . Lymphedema 05/15/2016  . Pulsatile tinnitus of right ear 01/16/2015  . Right-sided headache 01/16/2015  . Vertigo 01/16/2015  . 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 Pre-syncope 01/16/2015  . Dizziness and giddiness 01/16/2015  . Word finding difficulty 01/16/2015  . Altered awareness, transient 01/16/2015  . Rheumatoid arthritis(714.0) 03/28/2012  . Blood in stool 02/12/2012  . Acute posthemorrhagic anemia 02/12/2012  . Acute pyloric channel ulcer 02/12/2012  . Gastric AVM 02/12/2012  . Stricture and stenosis of esophagus 02/12/2012  . Fibromyalgia 02/11/2012  . Aortic insufficiency and aortic stenosis 02/11/2012  . Hx of breast cancer 02/11/2012  . Hx of adenomatous colonic polyps 02/11/2012  . GERD with stricture 02/11/2012    Past Surgical History:  Procedure Laterality Date  . ABDOMINAL HYSTERECTOMY  1995  . APPENDECTOMY  1956  . BREAST BIOPSY  1972  . BREAST LUMPECTOMY Left   . [place] & 2005  . CATARACT EXTRACTION    . CHOLECYSTECTOMY  1999  . COLONOSCOPY  02/12/2012   Procedure: COLONOSCOPY;  Surgeon: Inda Castle, MD;  Location: [facility];  Service: Endoscopy;  Laterality: N/A;  . DILATION AND CURETTAGE OF UTERUS    . ESOPHAGOGASTRODUODENOSCOPY  02/12/2012   Procedure: ESOPHAGOGASTRODUODENOSCOPY (EGD);  Surgeon: Inda Castle, MD;  Location: [facility];  Service: Endoscopy;  Laterality: N/A;  . HOT HEMOSTASIS  02/12/2012   Procedure: HOT HEMOSTASIS (  ARGON PLASMA COAGULATION/BICAP);  Surgeon: Inda Castle, MD;  Location: [facility];  Service: Endoscopy;  Laterality: N/A;  . MASTECTOMY  1986  . THYROID CYST EXCISION  2000  . TONSILLECTOMY  1968     OB History   No obstetric history on file.     Family History  Problem Relation Age of Onset  . Diabetes Mother   . Heart disease Mother   . Heart disease Father   . Heart attack Father   . Kidney disease Maternal Grandmother   . Liver disease Maternal  Grandfather   . Heart disease Paternal Grandmother   . Breast cancer Paternal Grandmother   . Heart disease Paternal Grandfather   . Heart disease Brother   . Heart disease Brother   . Parkinson's disease Cousin        father's side  . Parkinson's disease Maternal Uncle   . Colon cancer Neg Hx     Social History   Tobacco Use  . Smoking status: Never Smoker  . Smokeless tobacco: Never Used  Substance Use Topics  . Alcohol use: No  . Drug use: No    Home Medications Prior to Admission medications   Medication Sig Start Date End Date Taking? Authorizing Provider  amoxicillin (AMOXIL) 500 MG capsule Take 2,000 mg by mouth See admin instructions. Take 4 capsules prior to having teeth cleaned or dental procedure    Yes [provider]  aspirin EC 81 MG tablet Take 81 mg by mouth daily.   Yes [provider]  Biotin 1000 MCG tablet Take 1,000 mcg by mouth daily.   Yes [provider]  carisoprodol (SOMA) 250 MG tablet Take 250 mg by mouth every 8 (eight) hours as needed (for muscle spasms).  02/12/20  Yes [provider]  cyanocobalamin 2000 MCG tablet Take 500 mcg by mouth daily.    Yes [provider]  diltiazem (CARDIZEM SR) 90 MG 12 hr capsule TAKE 1 CAPSULE(90 MG) BY MOUTH TWICE DAILY Patient taking differently: Take 90 mg by mouth 2 (two) times daily.  10/02/19  Yes Adrian Prows, MD  ezetimibe (ZETIA) 10 MG tablet Take 10 mg by mouth daily. 12/13/18  Yes [provider]  fluvastatin (LESCOL) 20 MG capsule Take 20 mg by mouth daily. 11/14/19  Yes [provider]  folic acid (FOLVITE) Q000111Q MCG tablet Take 800 mcg by mouth daily.   Yes [provider]  HYDROcodone-acetaminophen (NORCO) 10-325 MG tablet Take 1 tablet by mouth 2 (two) times daily as needed for moderate pain.  02/12/20  Yes [provider]  leflunomide (ARAVA) 20 MG tablet Take 20 mg by mouth daily. 12/15/18  Yes [provider]  lidocaine  (LIDODERM) 5 % Place 1 patch onto the skin daily. Remove & Discard patch within 12 hours or as directed by MD 02/10/20  Yes Alroy Bailiff, [place], PA-C  losartan (COZAAR) 25 MG tablet Take 25 mg by mouth daily.   Yes [provider]  omeprazole-sodium bicarbonate (ZEGERID) 40-1100 MG per capsule Take 1 capsule by mouth daily.    Yes [provider]  sulfaSALAzine (AZULFIDINE) 500 MG tablet Take 500 mg by mouth 2 (two) times daily.   Yes [provider]  zolpidem (AMBIEN) 10 MG tablet Take 5 mg by mouth at bedtime.    Yes [provider]    Allergies    Patient has no known allergies.  Review of Systems   Review of Systems  Constitutional: Negative for fever.  Respiratory: Negative for shortness of breath.   Cardiovascular: Negative for chest pain.  Gastrointestinal: Negative for abdominal pain.  Genitourinary: Negative for difficulty urinating and dysuria.  Musculoskeletal: Positive for back pain.  Neurological: Negative for weakness and numbness.  All other systems reviewed and are negative.   Physical Exam Updated Vital Signs BP (!) 160/75 Comment: Simultaneous filing. User may not have seen previous data.  Pulse 68   Temp 98.1 F (36.7 C) (Oral)   Resp 12   SpO2 97%   Physical Exam Vitals and nursing note reviewed.  Constitutional:      Appearance: She is well-developed.     Comments: Overweight, non-ill-appearing  HENT:     Head: Normocephalic and atraumatic.     Mouth/Throat:     Mouth: Mucous membranes are moist.  Eyes:     Pupils: Pupils are equal, round, and reactive to light.  Cardiovascular:     Rate and Rhythm: Normal rate and regular rhythm.     Heart sounds: Normal heart sounds.  Pulmonary:     Effort: Pulmonary effort is normal. No respiratory distress.     Breath sounds: No wheezing.  Abdominal:     General: Bowel sounds are normal.     Palpations: Abdomen is soft.     Tenderness: There is no abdominal tenderness.    Musculoskeletal:     Cervical back: Neck supple.     Comments: Tenderness to palpation lower lumbar spine, no step-off or deformity noted, tenderness into the left glute with reproduction of symptoms, negative straight leg raise  Skin:    General: Skin is warm and dry.  Neurological:     Mental Status: She is alert and oriented to person, place, and time.     Comments: 5 out of 5 strength bilateral lower extremities, normal reflexes, no clonus  Psychiatric:        Mood and Affect: Mood normal.     ED Results / Procedures / Treatments   Labs (all labs ordered are listed, but only abnormal results are displayed) Labs Reviewed  URINALYSIS, ROUTINE W REFLEX MICROSCOPIC    EKG None  Radiology MR LUMBAR SPINE WO CONTRAST  Result Date: 02/15/2020 CLINICAL DATA:  Low back pain.  Difficulty walking. EXAM: MRI LUMBAR SPINE WITHOUT CONTRAST TECHNIQUE: Multiplanar, multisequence MR imaging of the lumbar spine was performed. No intravenous contrast was administered. COMPARISON:  10/09/2017 FINDINGS: Segmentation:  Standard Alignment:  Grade 1 retrolisthesis at L1-2 Vertebrae:  No fracture, evidence of discitis, or bone lesion. Conus medullaris and cauda equina: Conus extends to the L1 level. Conus and cauda equina appear normal. Paraspinal and other soft tissues: Negative Disc levels: T11-12: Normal. T12-L1: Small left subarticular disc protrusion without stenosis. L1-L2: Worsened disc bulge with new/increased left subarticular protrusion component. Left lateral recess narrowing without central spinal canal stenosis. No neural foraminal stenosis. L2-L3: Left asymmetric disc bulge. There is no spinal canal stenosis. No neural foraminal stenosis. L3-L4: Intermediate sized disc bulge. Moderate facet hypertrophy. Mild narrowing of both lateral recesses without central spinal canal stenosis. No neural foraminal stenosis. L4-L5: Right asymmetric disc bulge, unchanged. Right lateral recess narrowing without  central spinal canal stenosis. Mild right neural foraminal stenosis. The far lateral component of the disc is in close proximity to the extraforaminal right L4 nerve root. L5-S1: Severe facet hypertrophy and small disc bulge. Mild spinal canal stenosis. No neural foraminal stenosis. Visualized sacrum: Normal. IMPRESSION: 1. Worsened L1-L2 disc bulge with new/increased left subarticular protrusion component. Left lateral recess  narrowing could contribute to L2 radiculopathy. 2. Mild spinal canal stenosis at L5-S1 due to combination of disc bulge and facet arthrosis, unchanged. 3. Mild right L4-5 neural foraminal stenosis, unchanged, with disc possibly encroaching on the extraforaminal right L4 nerve root. 4. Bilateral lateral recess stenosis at L3-4, unchanged. Electronically Signed   By: Ulyses Jarred M.D.   On: 02/15/2020 03:26    Procedures Procedures (including critical care time)  Medications Ordered in ED Medications  dexamethasone (DECADRON) injection 10 mg (10 mg Intramuscular Given 02/15/20 0200)    ED Course  I have reviewed the triage vital signs and the nursing notes.  Pertinent labs & imaging results that were available during my care of the patient were reviewed by me and considered in my medical decision making (see chart for details).    MDM Rules/Calculators/A&P                      Patient presents with ongoing and worsening lower back pain and left buttock pain.  She is able to providers that Saturday.  She has no hard signs of cauda equina.  Her only red flag is her age.  However, given worsening pain without definitive imaging, feel she warrants further work-up with MRI today.  Comfortable laying flat.  She was given a dose of Decadron.  MRI shows significantly bulged disc at L1/L2.  It is on the left.  This is likely clinically correlating to her symptoms.  On repeat exam, patient is resting comfortably.  She states that her pain improved enough for her to get up and  ambulate to the bathroom.  I have encouraged her to follow-up closely with her orthopedist given findings.  She might benefit from a longer course of steroid and/or physical therapy.  After history, exam, and medical workup I feel the patient has been appropriately medically screened and is safe for discharge home. Pertinent diagnoses were discussed with the patient. Patient was given return precautions.   Final Clinical Impression(s) / ED Diagnoses Final diagnoses:  Lumbar radiculopathy    Rx / DC Orders ED Discharge Orders    None       Merryl Hacker, MD 02/15/20 [phone]

## 2020-02-15 NOTE — ED Notes (Signed)
Pt taken to MRI  

## 2020-02-15 NOTE — Discharge Instructions (Addendum)
You were seen today for back pain.  You were found to have a herniated disc in your lumbar spine.  You were given a dose of Decadron.  Follow-up closely with your orthopedist.

## 2020-02-15 NOTE — ED Notes (Signed)
Pt stated that she felt fine, After her intial discharge, she stated that she was in pain when she sat in the wheel chair. The Pt asked for Pain medication  Before agreeing to be discharged. Dr. Dina Rich ordered pain medication which was administered.

## 2020-02-21 ENCOUNTER — Ambulatory Visit: Payer: Self-pay | Admitting: Neurology

## 2020-02-21 DIAGNOSIS — M5416 Radiculopathy, lumbar region: Secondary | ICD-10-CM | POA: Diagnosis not present

## 2020-02-21 DIAGNOSIS — M545 Low back pain: Secondary | ICD-10-CM | POA: Diagnosis not present

## 2020-02-21 DIAGNOSIS — M5136 Other intervertebral disc degeneration, lumbar region: Secondary | ICD-10-CM | POA: Diagnosis not present

## 2020-02-21 DIAGNOSIS — M4316 Spondylolisthesis, lumbar region: Secondary | ICD-10-CM | POA: Diagnosis not present

## 2020-02-22 ENCOUNTER — Other Ambulatory Visit: Payer: Self-pay | Admitting: Neurosurgery

## 2020-02-23 ENCOUNTER — Ambulatory Visit: Payer: Medicare Other | Admitting: Cardiology

## 2020-02-23 ENCOUNTER — Encounter: Payer: Self-pay | Admitting: Cardiology

## 2020-02-24 NOTE — Progress Notes (Signed)
Spoke with Dr. Roanna Banning with anesthesia.  It is ok for the patient to be tested on Monday for surgery on friday

## 2020-02-25 NOTE — Pre-Procedure Instructions (Addendum)
[id number] - [place] [address], [place] [address] Phone: [phone] Fax: [phone]    Your procedure is scheduled on Fri., March 01, 2020 from 2:29PM- 4:04PM  Report to [facility] Entrance "A" at 12:30PM  Call this number if you have problems the morning of surgery:  [phone]   Remember:  Do not eat or drink after midnight on June 10th    Take these medicines the morning of surgery with A SIP OF WATER: Diltiazem (CARDIZEM SR)     Ezetimibe (ZETIA)  Leflunomide (ARAVA) Omeprazole-sodium bicarbonate (ZEGERID)           If Needed: HYDROcodone-acetaminophen ([place])  Follow your surgeon's instructions on when to stop Aspirin.  If no instructions were given by your surgeon then you will need to call the office to get those instructions.    As of today, STOP taking all Aspirin (unless instructed by your doctor) and Other Aspirin containing products, Vitamins, Fish oils, and Herbal medications. Also stop all NSAIDS i.e. Advil, Ibuprofen, Motrin, Aleve, Anaprox, Naproxen, BC, Goody Powders, and all Supplements. Including: SulfaSALAzine (AZULFIDINE)  No Smoking of any kind, Tobacco, or Alcohol products 24 hours prior to your procedure. If you use a Cpap at night, you may bring all equipment for your overnight stay.  Remember:  Do not wear jewelry, make-up or nail polish.  Do not wear lotions, powders, or perfumes, or deodorant.  Do not shave 48 hours prior to surgery.  Do not bring valuables to the hospital.  [facility] is not responsible for any belongings or valuables.  Contacts, dentures or bridgework may not be worn into surgery.    For patients admitted to the hospital, discharge time will be determined by your treatment team.  Patients discharged the day of surgery will not be allowed to drive home.    Special instructions:    [facility]- Preparing For  Surgery  Before surgery, you can play an important role. Because skin is not sterile, your skin needs to be as free of germs as possible. You can reduce the number of germs on your skin by washing with CHG (chlorahexidine gluconate) Soap before surgery.  CHG is an antiseptic cleaner which kills germs and bonds with the skin to continue killing germs even after washing.    Oral Hygiene is also important to reduce your risk of infection.  Remember - BRUSH YOUR TEETH THE MORNING OF SURGERY WITH YOUR REGULAR TOOTHPASTE  Please do not use if you have an allergy to CHG or antibacterial soaps. If your skin becomes reddened/irritated stop using the CHG.  Do not shave (including legs and underarms) for at least 48 hours prior to first CHG shower. It is OK to shave your face.  Please follow these instructions carefully.   1. Shower the NIGHT BEFORE SURGERY and the MORNING OF SURGERY with CHG.   2. If you chose to wash your hair, wash your hair first as usual with your normal shampoo.  3. After you shampoo, rinse your hair and body thoroughly to remove the shampoo.  4. Use CHG as you would any other liquid soap. You can apply CHG directly to the skin and wash gently with a scrungie or a clean washcloth.   5. Apply the CHG Soap to your body ONLY FROM THE NECK DOWN.  Do not use on open wounds or open sores. Avoid contact with your eyes,  ears, mouth and genitals (private parts). Wash Face and genitals (private parts)  with your normal soap.  6. Wash thoroughly, paying special attention to the area where your surgery will be performed.  7. Thoroughly rinse your body with warm water from the neck down.  8. DO NOT shower/wash with your normal soap after using and rinsing off the CHG Soap.  9. Pat yourself dry with a CLEAN TOWEL.  10. Wear CLEAN PAJAMAS to bed the night before surgery, wear comfortable clothes the morning of surgery  11. Place CLEAN SHEETS on your bed the night of your first shower and  DO NOT SLEEP WITH PETS.   Day of Surgery: Brush your teeth before arriving to the hospital. Do not apply any deodorants/lotions.  Please wear clean clothes to the hospital/surgery center.   Remember to brush your teeth WITH YOUR REGULAR TOOTHPASTE.   Please read over the following fact sheets that you were given.

## 2020-02-26 ENCOUNTER — Encounter (HOSPITAL_COMMUNITY)
Admission: RE | Admit: 2020-02-26 | Discharge: 2020-02-26 | Disposition: A | Discharge status: Home / Self Care | Payer: Medicare Other | Source: Ambulatory Visit | Attending: Neurosurgery | Admitting: Neurosurgery

## 2020-02-26 ENCOUNTER — Encounter (HOSPITAL_COMMUNITY): Payer: Self-pay

## 2020-02-26 ENCOUNTER — Other Ambulatory Visit (HOSPITAL_COMMUNITY)
Admission: RE | Admit: 2020-02-26 | Discharge: 2020-02-26 | Disposition: A | Discharge status: Home / Self Care | Payer: Medicare Other | Source: Ambulatory Visit | Attending: Neurosurgery | Admitting: Neurosurgery

## 2020-02-26 ENCOUNTER — Other Ambulatory Visit: Payer: Self-pay

## 2020-02-26 DIAGNOSIS — Z01812 Encounter for preprocedural laboratory examination: Secondary | ICD-10-CM | POA: Diagnosis not present

## 2020-02-26 DIAGNOSIS — Z20822 Contact with and (suspected) exposure to covid-19: Secondary | ICD-10-CM | POA: Insufficient documentation

## 2020-02-26 HISTORY — DX: Nonrheumatic mitral (valve) prolapse: I34.1

## 2020-02-26 HISTORY — DX: Pneumonia, unspecified organism: J18.9

## 2020-02-26 HISTORY — DX: Cardiac murmur, unspecified: R01.1

## 2020-02-26 HISTORY — DX: Cardiac arrhythmia, unspecified: I49.9

## 2020-02-26 LAB — CBC
HCT: 44.7 % (ref 36.0–46.0)
Hemoglobin: 13.6 g/dL (ref 12.0–15.0)
MCH: 31.2 pg (ref 26.0–34.0)
MCHC: 30.4 g/dL (ref 30.0–36.0)
MCV: 102.5 fL — ABNORMAL HIGH (ref 80.0–100.0)
Platelets: 273 10*3/uL (ref 150–400)
RBC: 4.36 MIL/uL (ref 3.87–5.11)
RDW: 14.5 % (ref 11.5–15.5)
WBC: 12 10*3/uL — ABNORMAL HIGH (ref 4.0–10.5)
nRBC: 0 % (ref 0.0–0.2)

## 2020-02-26 LAB — COMPREHENSIVE METABOLIC PANEL
ALT: 24 U/L (ref 0–44)
AST: 18 U/L (ref 15–41)
Albumin: 3.6 g/dL (ref 3.5–5.0)
Alkaline Phosphatase: 106 U/L (ref 38–126)
Anion gap: 10 (ref 5–15)
BUN: 31 mg/dL — ABNORMAL HIGH (ref 8–23)
CO2: 27 mmol/L (ref 22–32)
Calcium: 8.9 mg/dL (ref 8.9–10.3)
Chloride: 102 mmol/L (ref 98–111)
Creatinine, Ser: 1.21 mg/dL — ABNORMAL HIGH (ref 0.44–1.00)
GFR calc Af Amer: 51 mL/min — ABNORMAL LOW (ref >60)
GFR calc non Af Amer: 44 mL/min — ABNORMAL LOW (ref >60)
Glucose, Bld: 168 mg/dL — ABNORMAL HIGH (ref 70–99)
Potassium: 5.2 mmol/L — ABNORMAL HIGH (ref 3.5–5.1)
Sodium: 139 mmol/L (ref 135–145)
Total Bilirubin: 0.9 mg/dL (ref 0.3–1.2)
Total Protein: 6 g/dL — ABNORMAL LOW (ref 6.5–8.1)

## 2020-02-26 LAB — SARS CORONAVIRUS 2 (TAT 6-24 HRS): SARS Coronavirus 2: NEGATIVE

## 2020-02-26 LAB — SURGICAL PCR SCREEN
MRSA, PCR: POSITIVE — AB
Staphylococcus aureus: POSITIVE — AB

## 2020-02-26 NOTE — Progress Notes (Signed)
PCP - Dr. Jani Gravel  Cardiologist - Dr. Einar Gip  Chest x-ray -   EKG - 08/24/2020  Stress Test -   ECHO - 2019  Cardiac Cath - no  AICD- PM- LOOP-  Sleep Study - no CPAP - no  LABS-CBC, CMP- patient reports that hepatic levels are usually elevated a bit due to medicatios for RA.  ASA-  ERAS-no  HA1C-na Fasting Blood Sugar - na Checks Blood Sugar na___ times a day  Anesthesia-  Pt denies having chest pain, sob, or fever at this time. All instructions explained to the pt, with a verbal understanding of the material. Pt agrees to go over the instructions while at home for a better understanding. Pt also instructed to self quarantine after being tested for COVID-19. The opportunity to ask questions was provided.

## 2020-02-26 NOTE — Progress Notes (Signed)
PCP - Dr. Jani Gravel  Cardiologist - Dr. Rockwell Alexandria  Chest x-ray -   EKG - 08/25/2019  Stress Test -   ECHO -   Cardiac Cath -   AICD- PM- LOOP-  Sleep Study -  CPAP -   LABS-  ASA-  ERAS-  HA1C- Fasting Blood Sugar -  Checks Blood Sugar _____ times a day  Anesthesia-  Pt denies having chest pain, sob, or fever at this time. All instructions explained to the pt, with a verbal understanding of the material. Pt agrees to go over the instructions while at home for a better understanding. Pt also instructed to self quarantine after being tested for COVID-19. The opportunity to ask questions was provided.

## 2020-02-26 NOTE — Pre-Procedure Instructions (Signed)
Report to [facility] Entrance "A" at 12:30PM, Friday, June 11.  Your procedure is scheduled on Fri., March 01, 2020 from 2:29PM- 4:04PM  Call this number if you have problems the morning of surgery:  [phone]   Remember:  Do not eat or drink after midnight on June 10th   Take these medicines the morning of surgery with A SIP OF WATER: Diltiazem (CARDIZEM SR)     Ezetimibe (ZETIA)  Leflunomide (ARAVA) Omeprazole-sodium bicarbonate (ZEGERID)           If Needed: HYDROcodone-acetaminophen ([place])  Follow your surgeon's instructions on when to stop Aspirin.  If no instructions were given by your surgeon then you will need to call the office to get those instructions.    As of today, STOP taking all Aspirin (unless instructed by your doctor) and Other Aspirin containing products, Vitamins, Fish oils, and Herbal medications. Also stop all NSAIDS i.e. Advil, Ibuprofen, Motrin, Aleve, Anaprox, Naproxen, BC, Goody Powders, and all Supplements. Including: SulfaSALAzine (AZULFIDINE)  No Smoking of any kind, Tobacco, or Alcohol products 24 hours prior to your procedure. If you use a Cpap at night, you may bring all equipment for your overnight stay.  Remember:  Do not wear jewelry, make-up or nail polish.  Do not wear lotions, powders, or perfumes, or deodorant.  Do not shave 48 hours prior to surgery.  Do not bring valuables to the hospital.  [facility] is not responsible for any belongings or valuables.  Contacts, dentures or bridgework may not be worn into surgery.    For patients admitted to the hospital, discharge time will be determined by your treatment team.  Patients discharged the day of surgery will not be allowed to drive home.    Special instructions:    [facility]- Preparing For Surgery  Before surgery, you can play an important role. Because skin is not sterile, your skin needs to be as free of germs as possible. You can reduce the number of germs on your  skin by washing with CHG (chlorahexidine gluconate) Soap before surgery.  CHG is an antiseptic cleaner which kills germs and bonds with the skin to continue killing germs even after washing.    Oral Hygiene is also important to reduce your risk of infection.  Remember - BRUSH YOUR TEETH THE MORNING OF SURGERY WITH YOUR REGULAR TOOTHPASTE  Please do not use if you have an allergy to CHG or antibacterial soaps. If your skin becomes reddened/irritated stop using the CHG.  Do not shave (including legs and underarms) for at least 48 hours prior to first CHG shower. It is OK to shave your face.  Please follow these instructions carefully.   1. Shower the NIGHT BEFORE SURGERY and the MORNING OF SURGERY with CHG.   2. If you chose to wash your hair, wash your hair first as usual with your normal shampoo.  3. After you shampoo, wash your face and private area with the soap you use at home, then rinse your hair and body thoroughly to remove the shampoo and soap.  4. Use CHG as you would any other liquid soap. You can apply CHG directly to the skin and wash gently with a scrungie or a clean washcloth.   5. Apply the CHG Soap to your body ONLY FROM THE NECK DOWN.  Do not use on open wounds or open sores. Avoid contact with your eyes, ears, mouth and genitals (private parts).   6. Wash thoroughly, paying special attention to the  area where your surgery will be performed.  7. Thoroughly rinse your body with warm water from the neck down.  8. DO NOT shower/wash with your normal soap after using and rinsing off the CHG Soap.  9. Pat yourself dry with a CLEAN TOWEL.  10. Wear CLEAN PAJAMAS to bed the night before surgery, wear comfortable clothes the morning of surgery  11. Place CLEAN SHEETS on your bed the night of your first shower and DO NOT SLEEP WITH PETS. Day of Surgery: Shower as instructed above.  Do not wear lotions, powders, or perfumes, or deodorant. Please wear clean clothes to the  hospital/surgery center.   Remember to brush your teeth WITH YOUR REGULAR TOOTHPASTE  Do not wear jewelry, make-up or nail polish.  Do not shave 48 hours prior to surgery.  Do not bring valuables to the hospital.  [facility] is not responsible for any belongings or valuables.  No Smoking of any kind, Tobacco, or Alcohol products 24 hours prior to your procedure. If you use a Cpap at night, you may bring all equipment for your overnight stay. Contacts, dentures or bridgework may not be worn into surgery.    For patients admitted to the hospital, discharge time will be determined by your treatment team.  Patients discharged the day of surgery will not be allowed to drive home.    Please read over the following fact sheets that you were given.

## 2020-03-01 ENCOUNTER — Encounter (HOSPITAL_COMMUNITY): Payer: Self-pay | Admitting: Neurosurgery

## 2020-03-01 ENCOUNTER — Observation Stay (HOSPITAL_COMMUNITY): Payer: Medicare Other

## 2020-03-01 ENCOUNTER — Encounter (HOSPITAL_COMMUNITY)
Admission: RE | Disposition: A | Discharge status: Home / Self Care | Payer: Self-pay | Source: Ambulatory Visit | Attending: Neurosurgery

## 2020-03-01 ENCOUNTER — Observation Stay (HOSPITAL_COMMUNITY)
Admission: RE | Admit: 2020-03-01 | Discharge: 2020-03-02 | Disposition: A | Discharge status: Home / Self Care | Payer: Medicare Other | Source: Ambulatory Visit | Attending: Neurosurgery | Admitting: Neurosurgery

## 2020-03-01 ENCOUNTER — Ambulatory Visit (HOSPITAL_COMMUNITY): Payer: Medicare Other

## 2020-03-01 ENCOUNTER — Ambulatory Visit (HOSPITAL_COMMUNITY): Payer: Medicare Other | Admitting: Certified Registered Nurse Anesthetist

## 2020-03-01 ENCOUNTER — Other Ambulatory Visit: Payer: Self-pay

## 2020-03-01 DIAGNOSIS — Z452 Encounter for adjustment and management of vascular access device: Secondary | ICD-10-CM

## 2020-03-01 DIAGNOSIS — E669 Obesity, unspecified: Secondary | ICD-10-CM | POA: Insufficient documentation

## 2020-03-01 DIAGNOSIS — M5116 Intervertebral disc disorders with radiculopathy, lumbar region: Secondary | ICD-10-CM | POA: Diagnosis not present

## 2020-03-01 DIAGNOSIS — I10 Essential (primary) hypertension: Secondary | ICD-10-CM | POA: Diagnosis not present

## 2020-03-01 DIAGNOSIS — Z79899 Other long term (current) drug therapy: Secondary | ICD-10-CM | POA: Insufficient documentation

## 2020-03-01 DIAGNOSIS — Z791 Long term (current) use of non-steroidal anti-inflammatories (NSAID): Secondary | ICD-10-CM | POA: Diagnosis not present

## 2020-03-01 DIAGNOSIS — Z6834 Body mass index (BMI) 34.0-34.9, adult: Secondary | ICD-10-CM | POA: Insufficient documentation

## 2020-03-01 DIAGNOSIS — M4726 Other spondylosis with radiculopathy, lumbar region: Secondary | ICD-10-CM | POA: Diagnosis not present

## 2020-03-01 DIAGNOSIS — Z853 Personal history of malignant neoplasm of breast: Secondary | ICD-10-CM | POA: Insufficient documentation

## 2020-03-01 DIAGNOSIS — M797 Fibromyalgia: Secondary | ICD-10-CM | POA: Insufficient documentation

## 2020-03-01 DIAGNOSIS — Z419 Encounter for procedure for purposes other than remedying health state, unspecified: Secondary | ICD-10-CM

## 2020-03-01 DIAGNOSIS — M48061 Spinal stenosis, lumbar region without neurogenic claudication: Secondary | ICD-10-CM | POA: Insufficient documentation

## 2020-03-01 DIAGNOSIS — M4316 Spondylolisthesis, lumbar region: Secondary | ICD-10-CM | POA: Insufficient documentation

## 2020-03-01 DIAGNOSIS — M5126 Other intervertebral disc displacement, lumbar region: Secondary | ICD-10-CM | POA: Diagnosis not present

## 2020-03-01 DIAGNOSIS — I341 Nonrheumatic mitral (valve) prolapse: Secondary | ICD-10-CM | POA: Diagnosis not present

## 2020-03-01 DIAGNOSIS — Z7982 Long term (current) use of aspirin: Secondary | ICD-10-CM | POA: Diagnosis not present

## 2020-03-01 DIAGNOSIS — Z9889 Other specified postprocedural states: Secondary | ICD-10-CM | POA: Diagnosis not present

## 2020-03-01 HISTORY — PX: LUMBAR LAMINECTOMY/DECOMPRESSION MICRODISCECTOMY: SHX5026

## 2020-03-01 SURGERY — LUMBAR LAMINECTOMY/DECOMPRESSION MICRODISCECTOMY 1 LEVEL
Anesthesia: General | Site: Spine Lumbar | Laterality: Left

## 2020-03-01 MED ORDER — HYDROXYZINE HCL 50 MG/ML IM SOLN: 50.0000 mg | Freq: Four times a day (QID) | INTRAMUSCULAR | Status: DC | PRN

## 2020-03-01 MED ORDER — ROCURONIUM BROMIDE 10 MG/ML (PF) SYRINGE
PREFILLED_SYRINGE | INTRAVENOUS | Status: AC
  Filled 2020-03-01: qty 10

## 2020-03-01 MED ORDER — POLYETHYLENE GLYCOL 3350 17 G PO PACK: 17.0000 g | PACK | Freq: Every day | ORAL | Status: DC | PRN

## 2020-03-01 MED ORDER — LACTATED RINGERS IV SOLN: INTRAVENOUS | Status: DC

## 2020-03-01 MED ORDER — PHENOL 1.4 % MT LIQD: 1.0000 | OROMUCOSAL | Status: DC | PRN

## 2020-03-01 MED ORDER — FENTANYL CITRATE (PF) 250 MCG/5ML IJ SOLN
INTRAMUSCULAR | Status: DC | PRN
  Administered 2020-03-01: 100 ug via INTRAVENOUS
  Administered 2020-03-01: 25 ug via INTRAVENOUS

## 2020-03-01 MED ORDER — ONDANSETRON HCL 4 MG/2ML IJ SOLN
INTRAMUSCULAR | Status: AC
  Filled 2020-03-01: qty 2

## 2020-03-01 MED ORDER — HYDROCODONE-ACETAMINOPHEN 5-325 MG PO TABS
1.0000 | ORAL_TABLET | ORAL | Status: DC | PRN
  Administered 2020-03-01: 2 via ORAL
  Filled 2020-03-01: qty 2

## 2020-03-01 MED ORDER — LACTATED RINGERS IV SOLN: INTRAVENOUS | Status: DC | PRN

## 2020-03-01 MED ORDER — ZOLPIDEM TARTRATE 5 MG PO TABS: 5.0000 mg | ORAL_TABLET | Freq: Every evening | ORAL | Status: DC | PRN

## 2020-03-01 MED ORDER — BISACODYL 10 MG RE SUPP: 10.0000 mg | Freq: Every day | RECTAL | Status: DC | PRN

## 2020-03-01 MED ORDER — MENTHOL 3 MG MT LOZG: 1.0000 | LOZENGE | OROMUCOSAL | Status: DC | PRN

## 2020-03-01 MED ORDER — PHENYLEPHRINE HCL-NACL 10-0.9 MG/250ML-% IV SOLN
INTRAVENOUS | Status: DC | PRN
  Administered 2020-03-01: 50 ug/min via INTRAVENOUS

## 2020-03-01 MED ORDER — ONDANSETRON HCL 4 MG/2ML IJ SOLN: 4.0000 mg | Freq: Once | INTRAMUSCULAR | Status: DC | PRN

## 2020-03-01 MED ORDER — OXYCODONE HCL 5 MG PO TABS
5.0000 mg | ORAL_TABLET | ORAL | Status: DC | PRN
  Administered 2020-03-02: 5 mg via ORAL
  Administered 2020-03-02 (×3): 10 mg via ORAL
  Filled 2020-03-01 (×4): qty 2

## 2020-03-01 MED ORDER — THROMBIN 5000 UNITS EX SOLR
OROMUCOSAL | Status: DC | PRN
  Administered 2020-03-01: 5 mL

## 2020-03-01 MED ORDER — SUGAMMADEX SODIUM 500 MG/5ML IV SOLN
INTRAVENOUS | Status: AC
  Filled 2020-03-01: qty 5

## 2020-03-01 MED ORDER — VANCOMYCIN HCL IN DEXTROSE 1-5 GM/200ML-% IV SOLN
1000.0000 mg | Freq: Once | INTRAVENOUS | Status: AC
  Administered 2020-03-01: 1000 mg via INTRAVENOUS
  Filled 2020-03-01: qty 200

## 2020-03-01 MED ORDER — ASPIRIN EC 81 MG PO TBEC: 81.0000 mg | DELAYED_RELEASE_TABLET | Freq: Every day | ORAL | Status: DC

## 2020-03-01 MED ORDER — ROCURONIUM BROMIDE 10 MG/ML (PF) SYRINGE
PREFILLED_SYRINGE | INTRAVENOUS | Status: DC | PRN
  Administered 2020-03-01: 55 mg via INTRAVENOUS

## 2020-03-01 MED ORDER — EPHEDRINE SULFATE 50 MG/ML IJ SOLN
INTRAMUSCULAR | Status: DC | PRN
Start: 2020-03-01 — End: 2020-03-01
  Administered 2020-03-01 (×2): 5 mg via INTRAVENOUS

## 2020-03-01 MED ORDER — METHYLPREDNISOLONE ACETATE 80 MG/ML IJ SUSP
INTRAMUSCULAR | Status: AC
  Filled 2020-03-01: qty 1

## 2020-03-01 MED ORDER — FLEET ENEMA 7-19 GM/118ML RE ENEM: 1.0000 | ENEMA | Freq: Once | RECTAL | Status: DC | PRN

## 2020-03-01 MED ORDER — FENTANYL CITRATE (PF) 250 MCG/5ML IJ SOLN
INTRAMUSCULAR | Status: AC
  Filled 2020-03-01: qty 5

## 2020-03-01 MED ORDER — FENTANYL CITRATE (PF) 100 MCG/2ML IJ SOLN: 25.0000 ug | INTRAMUSCULAR | Status: DC | PRN

## 2020-03-01 MED ORDER — 0.9 % SODIUM CHLORIDE (POUR BTL) OPTIME
TOPICAL | Status: DC | PRN
  Administered 2020-03-01: 1000 mL

## 2020-03-01 MED ORDER — CEFAZOLIN SODIUM-DEXTROSE 2-4 GM/100ML-% IV SOLN
2.0000 g | Freq: Three times a day (TID) | INTRAVENOUS | Status: AC
  Administered 2020-03-01 – 2020-03-02 (×2): 2 g via INTRAVENOUS
  Filled 2020-03-01 (×2): qty 100

## 2020-03-01 MED ORDER — METHOCARBAMOL 500 MG PO TABS
ORAL_TABLET | ORAL | Status: AC
  Filled 2020-03-01: qty 1

## 2020-03-01 MED ORDER — CHLORHEXIDINE GLUCONATE CLOTH 2 % EX PADS: 6.0000 | MEDICATED_PAD | Freq: Every day | CUTANEOUS | Status: DC

## 2020-03-01 MED ORDER — SODIUM CHLORIDE 0.9 % IV SOLN: 250.0000 mL | INTRAVENOUS | Status: DC

## 2020-03-01 MED ORDER — OXYCODONE HCL 5 MG PO TABS: 5.0000 mg | ORAL_TABLET | ORAL | Status: DC | PRN

## 2020-03-01 MED ORDER — EZETIMIBE 10 MG PO TABS
10.0000 mg | ORAL_TABLET | Freq: Every day | ORAL | Status: DC
  Filled 2020-03-01: qty 1

## 2020-03-01 MED ORDER — SODIUM CHLORIDE 0.9% FLUSH: 3.0000 mL | INTRAVENOUS | Status: DC | PRN

## 2020-03-01 MED ORDER — CHLORHEXIDINE GLUCONATE CLOTH 2 % EX PADS: 6.0000 | MEDICATED_PAD | Freq: Once | CUTANEOUS | Status: DC

## 2020-03-01 MED ORDER — DEXAMETHASONE SODIUM PHOSPHATE 10 MG/ML IJ SOLN
INTRAMUSCULAR | Status: DC | PRN
  Administered 2020-03-01: 10 mg via INTRAVENOUS

## 2020-03-01 MED ORDER — LIDOCAINE 2% (20 MG/ML) 5 ML SYRINGE
INTRAMUSCULAR | Status: DC | PRN
  Administered 2020-03-01: 60 mg via INTRAVENOUS

## 2020-03-01 MED ORDER — HYDROMORPHONE HCL 1 MG/ML IJ SOLN: 0.5000 mg | INTRAMUSCULAR | Status: DC | PRN

## 2020-03-01 MED ORDER — FENTANYL CITRATE (PF) 100 MCG/2ML IJ SOLN
INTRAMUSCULAR | Status: AC
  Filled 2020-03-01: qty 2

## 2020-03-01 MED ORDER — SUCCINYLCHOLINE CHLORIDE 200 MG/10ML IV SOSY
PREFILLED_SYRINGE | INTRAVENOUS | Status: AC
  Filled 2020-03-01: qty 10

## 2020-03-01 MED ORDER — PRAVASTATIN SODIUM 10 MG PO TABS: 20.0000 mg | ORAL_TABLET | Freq: Every day | ORAL | Status: DC

## 2020-03-01 MED ORDER — OXYCODONE HCL 5 MG PO TABS
ORAL_TABLET | ORAL | Status: AC
  Filled 2020-03-01: qty 1

## 2020-03-01 MED ORDER — PROPOFOL 10 MG/ML IV BOLUS
INTRAVENOUS | Status: AC
  Filled 2020-03-01: qty 20

## 2020-03-01 MED ORDER — ACETAMINOPHEN 325 MG PO TABS: 650.0000 mg | ORAL_TABLET | ORAL | Status: DC | PRN

## 2020-03-01 MED ORDER — ONDANSETRON HCL 4 MG PO TABS: 4.0000 mg | ORAL_TABLET | Freq: Four times a day (QID) | ORAL | Status: DC | PRN

## 2020-03-01 MED ORDER — METHYLPREDNISOLONE ACETATE 80 MG/ML IJ SUSP
INTRAMUSCULAR | Status: DC | PRN
  Administered 2020-03-01: 80 mg

## 2020-03-01 MED ORDER — DOCUSATE SODIUM 100 MG PO CAPS
100.0000 mg | ORAL_CAPSULE | Freq: Two times a day (BID) | ORAL | Status: DC
  Administered 2020-03-01: 100 mg via ORAL
  Filled 2020-03-01: qty 1

## 2020-03-01 MED ORDER — MUPIROCIN 2 % EX OINT
1.0000 "application " | TOPICAL_OINTMENT | Freq: Two times a day (BID) | CUTANEOUS | Status: DC
  Administered 2020-03-01 – 2020-03-02 (×2): 1 via NASAL
  Filled 2020-03-01: qty 22

## 2020-03-01 MED ORDER — EPHEDRINE 5 MG/ML INJ
INTRAVENOUS | Status: AC
  Filled 2020-03-01: qty 10

## 2020-03-01 MED ORDER — DEXAMETHASONE SODIUM PHOSPHATE 10 MG/ML IJ SOLN
INTRAMUSCULAR | Status: AC
  Filled 2020-03-01: qty 1

## 2020-03-01 MED ORDER — CEFAZOLIN SODIUM-DEXTROSE 2-4 GM/100ML-% IV SOLN
2.0000 g | INTRAVENOUS | Status: AC
  Administered 2020-03-01: 2 g via INTRAVENOUS
  Filled 2020-03-01: qty 100

## 2020-03-01 MED ORDER — FOLIC ACID 1 MG PO TABS: 1.0000 mg | ORAL_TABLET | Freq: Every day | ORAL | Status: DC

## 2020-03-01 MED ORDER — LOSARTAN POTASSIUM 50 MG PO TABS: 25.0000 mg | ORAL_TABLET | Freq: Every day | ORAL | Status: DC

## 2020-03-01 MED ORDER — BUPIVACAINE HCL (PF) 0.5 % IJ SOLN
INTRAMUSCULAR | Status: DC | PRN
  Administered 2020-03-01: 5 mL

## 2020-03-01 MED ORDER — VITAMIN B-12 1000 MCG PO TABS: 500.0000 ug | ORAL_TABLET | Freq: Every day | ORAL | Status: DC

## 2020-03-01 MED ORDER — ONDANSETRON HCL 4 MG/2ML IJ SOLN: 4.0000 mg | Freq: Four times a day (QID) | INTRAMUSCULAR | Status: DC | PRN

## 2020-03-01 MED ORDER — METHOCARBAMOL 1000 MG/10ML IJ SOLN
500.0000 mg | Freq: Four times a day (QID) | INTRAVENOUS | Status: DC | PRN
  Filled 2020-03-01: qty 5

## 2020-03-01 MED ORDER — LEFLUNOMIDE 20 MG PO TABS
20.0000 mg | ORAL_TABLET | Freq: Every day | ORAL | Status: DC
  Filled 2020-03-01: qty 1

## 2020-03-01 MED ORDER — THROMBIN 5000 UNITS EX SOLR
CUTANEOUS | Status: AC
  Filled 2020-03-01: qty 5000

## 2020-03-01 MED ORDER — ZOLPIDEM TARTRATE 5 MG PO TABS: 5.0000 mg | ORAL_TABLET | Freq: Every day | ORAL | Status: DC

## 2020-03-01 MED ORDER — SUGAMMADEX SODIUM 200 MG/2ML IV SOLN
INTRAVENOUS | Status: DC | PRN
  Administered 2020-03-01: 200 mg via INTRAVENOUS

## 2020-03-01 MED ORDER — METHOCARBAMOL 500 MG PO TABS
500.0000 mg | ORAL_TABLET | Freq: Four times a day (QID) | ORAL | Status: DC | PRN
  Administered 2020-03-01 – 2020-03-02 (×4): 500 mg via ORAL
  Filled 2020-03-01 (×3): qty 1

## 2020-03-01 MED ORDER — PANTOPRAZOLE SODIUM 40 MG PO TBEC: 40.0000 mg | DELAYED_RELEASE_TABLET | Freq: Every day | ORAL | Status: DC

## 2020-03-01 MED ORDER — LIDOCAINE 2% (20 MG/ML) 5 ML SYRINGE
INTRAMUSCULAR | Status: AC
  Filled 2020-03-01: qty 5

## 2020-03-01 MED ORDER — OXYCODONE HCL 5 MG/5ML PO SOLN: 5.0000 mg | Freq: Once | ORAL | Status: AC | PRN

## 2020-03-01 MED ORDER — LIDOCAINE 5 % EX PTCH
1.0000 | MEDICATED_PATCH | CUTANEOUS | Status: DC
  Administered 2020-03-01: 1 via TRANSDERMAL
  Filled 2020-03-01 (×2): qty 1

## 2020-03-01 MED ORDER — ALUM & MAG HYDROXIDE-SIMETH 200-200-20 MG/5ML PO SUSP: 30.0000 mL | Freq: Four times a day (QID) | ORAL | Status: DC | PRN

## 2020-03-01 MED ORDER — BIOTIN 1000 MCG PO TABS: 1000.0000 ug | ORAL_TABLET | Freq: Every day | ORAL | Status: DC

## 2020-03-01 MED ORDER — ARTIFICIAL TEARS OPHTHALMIC OINT
TOPICAL_OINTMENT | OPHTHALMIC | Status: AC
  Filled 2020-03-01: qty 3.5

## 2020-03-01 MED ORDER — CHLORHEXIDINE GLUCONATE 0.12 % MT SOLN
15.0000 mL | Freq: Once | OROMUCOSAL | Status: AC
  Administered 2020-03-01: 15 mL via OROMUCOSAL
  Filled 2020-03-01: qty 15

## 2020-03-01 MED ORDER — SULFASALAZINE 500 MG PO TABS
500.0000 mg | ORAL_TABLET | Freq: Two times a day (BID) | ORAL | Status: DC
  Administered 2020-03-01: 500 mg via ORAL
  Filled 2020-03-01 (×3): qty 1

## 2020-03-01 MED ORDER — KCL IN DEXTROSE-NACL 20-5-0.45 MEQ/L-%-% IV SOLN: INTRAVENOUS | Status: DC

## 2020-03-01 MED ORDER — METHYLPREDNISOLONE 4 MG PO TABS: 4.0000 mg | ORAL_TABLET | Freq: Every day | ORAL | Status: DC

## 2020-03-01 MED ORDER — ACETAMINOPHEN 650 MG RE SUPP: 650.0000 mg | RECTAL | Status: DC | PRN

## 2020-03-01 MED ORDER — LIDOCAINE-EPINEPHRINE 1 %-1:100000 IJ SOLN
INTRAMUSCULAR | Status: DC | PRN
  Administered 2020-03-01: 5 mL

## 2020-03-01 MED ORDER — ONDANSETRON HCL 4 MG/2ML IJ SOLN
INTRAMUSCULAR | Status: DC | PRN
  Administered 2020-03-01: 4 mg via INTRAVENOUS

## 2020-03-01 MED ORDER — ORAL CARE MOUTH RINSE: 15.0000 mL | Freq: Once | OROMUCOSAL | Status: AC

## 2020-03-01 MED ORDER — FENTANYL CITRATE (PF) 100 MCG/2ML IJ SOLN
INTRAMUSCULAR | Status: DC | PRN
  Administered 2020-03-01: 100 ug via INTRAVENOUS

## 2020-03-01 MED ORDER — PANTOPRAZOLE SODIUM 40 MG IV SOLR
40.0000 mg | Freq: Every day | INTRAVENOUS | Status: DC
Start: 2020-03-01 — End: 2020-03-01

## 2020-03-01 MED ORDER — PROPOFOL 10 MG/ML IV BOLUS
INTRAVENOUS | Status: DC | PRN
  Administered 2020-03-01: 140 mg via INTRAVENOUS

## 2020-03-01 MED ORDER — SODIUM CHLORIDE (PF) 0.9 % IJ SOLN
INTRAMUSCULAR | Status: AC
  Filled 2020-03-01: qty 10

## 2020-03-01 MED ORDER — DILTIAZEM HCL ER 90 MG PO CP12
90.0000 mg | ORAL_CAPSULE | Freq: Two times a day (BID) | ORAL | Status: DC
  Administered 2020-03-01: 90 mg via ORAL
  Filled 2020-03-01 (×3): qty 1

## 2020-03-01 MED ORDER — SODIUM CHLORIDE 0.9% FLUSH
3.0000 mL | Freq: Two times a day (BID) | INTRAVENOUS | Status: DC
  Administered 2020-03-01: 3 mL via INTRAVENOUS

## 2020-03-01 MED ORDER — BUPIVACAINE HCL (PF) 0.5 % IJ SOLN
INTRAMUSCULAR | Status: AC
  Filled 2020-03-01: qty 30

## 2020-03-01 MED ORDER — LIDOCAINE-EPINEPHRINE 1 %-1:100000 IJ SOLN
INTRAMUSCULAR | Status: AC
  Filled 2020-03-01: qty 1

## 2020-03-01 MED ORDER — OXYCODONE HCL 5 MG PO TABS
5.0000 mg | ORAL_TABLET | Freq: Once | ORAL | Status: AC | PRN
  Administered 2020-03-01: 5 mg via ORAL

## 2020-03-01 MED ORDER — CARISOPRODOL 250 MG PO TABS: 250.0000 mg | ORAL_TABLET | Freq: Three times a day (TID) | ORAL | Status: DC | PRN

## 2020-03-01 SURGICAL SUPPLY — 53 items
BAND RUBBER #18 3X1/16 STRL (MISCELLANEOUS) ×4 IMPLANT
BENZOIN TINCTURE PRP APPL 2/3 (GAUZE/BANDAGES/DRESSINGS) ×2 IMPLANT
BLADE CLIPPER SURG (BLADE) IMPLANT
BUR MATCHSTICK NEURO 3.0 LAGG (BURR) ×2 IMPLANT
BUR ROUND FLUTED 5 RND (BURR) ×2 IMPLANT
CANISTER SUCT 3000ML PPV (MISCELLANEOUS) ×2 IMPLANT
CARTRIDGE OIL MAESTRO DRILL (MISCELLANEOUS) ×1 IMPLANT
COVER WAND RF STERILE (DRAPES) ×2 IMPLANT
DECANTER SPIKE VIAL GLASS SM (MISCELLANEOUS) ×2 IMPLANT
DERMABOND ADVANCED (GAUZE/BANDAGES/DRESSINGS) ×1
DERMABOND ADVANCED .7 DNX12 (GAUZE/BANDAGES/DRESSINGS) ×1 IMPLANT
DIFFUSER DRILL AIR PNEUMATIC (MISCELLANEOUS) ×2 IMPLANT
DRAPE LAPAROTOMY 100X72X124 (DRAPES) ×2 IMPLANT
DRAPE MICROSCOPE LEICA (MISCELLANEOUS) ×2 IMPLANT
DRAPE SURG 17X23 STRL (DRAPES) ×2 IMPLANT
DRSG OPSITE POSTOP 4X6 (GAUZE/BANDAGES/DRESSINGS) ×2 IMPLANT
DURAPREP 26ML APPLICATOR (WOUND CARE) ×2 IMPLANT
ELECT REM PT RETURN 9FT ADLT (ELECTROSURGICAL) ×2
ELECTRODE REM PT RTRN 9FT ADLT (ELECTROSURGICAL) ×1 IMPLANT
GAUZE 4X4 16PLY RFD (DISPOSABLE) IMPLANT
GAUZE SPONGE 4X4 12PLY STRL (GAUZE/BANDAGES/DRESSINGS) IMPLANT
GLOVE BIO SURGEON STRL SZ8 (GLOVE) ×2 IMPLANT
GLOVE BIOGEL PI IND STRL 8 (GLOVE) ×1 IMPLANT
GLOVE BIOGEL PI IND STRL 8.5 (GLOVE) ×1 IMPLANT
GLOVE BIOGEL PI INDICATOR 8 (GLOVE) ×1
GLOVE BIOGEL PI INDICATOR 8.5 (GLOVE) ×1
GLOVE ECLIPSE 8.0 STRL XLNG CF (GLOVE) ×2 IMPLANT
GLOVE EXAM NITRILE XL STR (GLOVE) IMPLANT
GOWN STRL REUS W/ TWL LRG LVL3 (GOWN DISPOSABLE) IMPLANT
GOWN STRL REUS W/ TWL XL LVL3 (GOWN DISPOSABLE) ×1 IMPLANT
GOWN STRL REUS W/TWL 2XL LVL3 (GOWN DISPOSABLE) ×2 IMPLANT
GOWN STRL REUS W/TWL LRG LVL3 (GOWN DISPOSABLE)
GOWN STRL REUS W/TWL XL LVL3 (GOWN DISPOSABLE) ×2
HEMOSTAT POWDER KIT SURGIFOAM (HEMOSTASIS) ×2 IMPLANT
KIT BASIN OR (CUSTOM PROCEDURE TRAY) ×2 IMPLANT
KIT TURNOVER KIT B (KITS) ×2 IMPLANT
NEEDLE HYPO 18GX1.5 BLUNT FILL (NEEDLE) IMPLANT
NEEDLE HYPO 25X1 1.5 SAFETY (NEEDLE) ×2 IMPLANT
NEEDLE SPNL 18GX3.5 QUINCKE PK (NEEDLE) ×2 IMPLANT
NS IRRIG 1000ML POUR BTL (IV SOLUTION) ×2 IMPLANT
OIL CARTRIDGE MAESTRO DRILL (MISCELLANEOUS) ×2
PACK LAMINECTOMY NEURO (CUSTOM PROCEDURE TRAY) ×2 IMPLANT
PAD ARMBOARD 7.5X6 YLW CONV (MISCELLANEOUS) ×6 IMPLANT
SPONGE SURGIFOAM ABS GEL SZ50 (HEMOSTASIS) IMPLANT
STRIP CLOSURE SKIN 1/2X4 (GAUZE/BANDAGES/DRESSINGS) ×2 IMPLANT
SUT VIC AB 0 CT1 18XCR BRD8 (SUTURE) ×1 IMPLANT
SUT VIC AB 0 CT1 8-18 (SUTURE) ×2
SUT VIC AB 2-0 CT1 18 (SUTURE) ×2 IMPLANT
SUT VIC AB 3-0 SH 8-18 (SUTURE) ×2 IMPLANT
SYR 5ML LL (SYRINGE) IMPLANT
TOWEL GREEN STERILE (TOWEL DISPOSABLE) ×2 IMPLANT
TOWEL GREEN STERILE FF (TOWEL DISPOSABLE) ×2 IMPLANT
WATER STERILE IRR 1000ML POUR (IV SOLUTION) ×2 IMPLANT

## 2020-03-01 NOTE — Anesthesia Procedure Notes (Addendum)
Central Venous Catheter Insertion Performed by: Roberts Gaudy, MD, anesthesiologist Start/End6/07/2020 4:20 PM, 03/01/2020 4:25 PM Patient location: Pre-op. Preanesthetic checklist: patient identified, IV checked, site marked, risks and benefits discussed, surgical consent, monitors and equipment checked, pre-op evaluation, timeout performed and anesthesia consent Lidocaine 1% used for infiltration and patient sedated Hand hygiene performed  and maximum sterile barriers used  Catheter size: 8 Fr Total catheter length 16. Central line was placed.Double lumen Procedure performed using ultrasound guided technique. Ultrasound Notes:image(s) printed for medical record Attempts: 1 Following insertion, dressing applied and line sutured. Post procedure assessment: blood return through all ports  Patient tolerated the procedure well with no immediate complications.

## 2020-03-01 NOTE — Anesthesia Procedure Notes (Signed)
Procedure Name: Intubation Date/Time: 03/01/2020 2:49 PM Performed by: Suzy Bouchard, CRNA Pre-anesthesia Checklist: Patient identified, Emergency Drugs available, Suction available, Patient being monitored and Timeout performed Patient Re-evaluated:Patient Re-evaluated prior to induction Oxygen Delivery Method: Circle system utilized Preoxygenation: Pre-oxygenation with 100% oxygen Induction Type: IV induction Ventilation: Mask ventilation without difficulty Laryngoscope Size: Miller and 2 Grade View: Grade III Tube type: Oral Tube size: 7.0 mm Number of attempts: 1 Airway Equipment and Method: Stylet Placement Confirmation: positive ETCO2,  ETT inserted through vocal cords under direct vision and breath sounds checked- equal and bilateral Secured at: 21 cm Tube secured with: Tape Dental Injury: Teeth and Oropharynx as per pre-operative assessment

## 2020-03-01 NOTE — Brief Op Note (Signed)
03/01/2020  4:23 PM  PATIENT:  Cynthia Farrell  74 y.o. 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, spondylolisthesis, stenosis, spondylosis, lumbago, radiculopathy  PROCEDURE:  Procedure(s) with comments: Left Lumbar One-Two Microdiscectomy (Left) - Left Lumbar One-Two Microdiscectomy  SURGEON:  Surgeon(s) and Role:    Erline Levine, MD - Primary    * Vallarie Mare, MD - Assisting  PHYSICIAN ASSISTANT:   ASSISTANTS: Poteat, RN   Glenford Peers, NP   ANESTHESIA:   general  EBL: Minimal  BLOOD ADMINISTERED:none  DRAINS: none   LOCAL MEDICATIONS USED:  MARCAINE    and LIDOCAINE   SPECIMEN:  No Specimen  DISPOSITION OF SPECIMEN:  N/A  COUNTS:  YES  TOURNIQUET:  * No tourniquets in log *  DICTATION: DICTATION: Patient has a large L 12 disc rupture on the left with significant left leg weakness. It was elected to take her to surgery for left L 12 microdiscectomy.  Procedure: Patient was brought to the operating room and following the smooth and uncomplicated induction of general endotracheal anesthesia she was placed in a prone position on the Wilson frame. Low back was prepped and draped in the usual sterile fashion with betadine scrub and DuraPrep. Preoperative localizing X ray was obtained with a spinal needle.  Area of planned incision was infiltrated with local lidocaine. Incision was made in the midline and carried to the lumbodorsal fascia which was incised on the left side of midline. Subperiosteal dissection was performed exposing what was felt to be L 12 level. Intraoperative x-ray demonstrated marker probe at L 12.  A hemi-semi-laminectomy of L 1 was performed a high-speed drill and completed with Kerrison rongeurs and a generous foraminotomy was performed overlying the superior aspect of the L 2  lamina. Ligamentum flavum was detached and removed in a piecemeal fashion and the L 2 nerve root was decompressed laterally with removal of the superior aspect of the facet and ligamentum causing nerve root compression. The microscope was brought into the field and the L 2 nerve root and thecal sac were mobilized medially. This exposed a large amount of soft disc material and multiple free fragments of herniated disc material. Multiple fragments were removed. The lateral recess and foraminal region were also decompressed.  The interspace itself was not entered.  At this point it was felt that all neural elements were well decompressed and there was no evidence of residual loose disc material. The interspace was then irrigated with saline and no additional disc material was mobilized. Hemostasis was assured withSurgifoam and the interspace was irrigated with Depo-Medrol and fentanyl. The lumbodorsal fascia was closed with 0 Vicryl sutures the subcutaneous tissues reapproximated 2-0 Vicryl inverted sutures and the skin edges were reapproximated with 3-0 Vicryl subcuticular stitch. The wound is dressed with Dermabond and an occlusive dressing. Patient was extubated in the operating room and taken to recovery in stable and satisfactory condition having tolerated her operation well counts were correct at the end of the case.   PLAN OF CARE: Admit for overnight observation  PATIENT DISPOSITION:  PACU - hemodynamically stable.   Delay start of Pharmacological VTE agent (>24hrs) due to surgical blood loss or risk of bleeding: yes

## 2020-03-01 NOTE — Interval H&P Note (Signed)
History and Physical Interval Note:  03/01/2020 1:46 PM  Cynthia Farrell  has presented today for surgery, with the diagnosis of Herniated nucleus pulposus, Lumbar.  The various methods of treatment have been discussed with the patient and family. After consideration of risks, benefits and other options for treatment, the patient has consented to  Procedure(s) with comments: Left Lumbar 1-2 Microdiscectomy (Left) - 3C as a surgical intervention.  The patient's history has been reviewed, patient examined, no change in status, stable for surgery.  I have reviewed the patient's chart and labs.  Questions were answered to the patient's satisfaction.     Peggyann Shoals

## 2020-03-01 NOTE — Anesthesia Postprocedure Evaluation (Signed)
Anesthesia Post Note  Patient: Cynthia Farrell  Procedure(s) Performed: Left Lumbar One-Two Microdiscectomy (Left Spine Lumbar)     Patient location during evaluation: PACU Anesthesia Type: General Level of consciousness: awake and alert Pain management: pain level controlled Vital Signs Assessment: post-procedure vital signs reviewed and stable Respiratory status: spontaneous breathing, nonlabored ventilation, respiratory function stable and patient connected to nasal cannula oxygen Cardiovascular status: blood pressure returned to baseline and stable Postop Assessment: no apparent nausea or vomiting Anesthetic complications: no   No complications documented.  Last Vitals:  Vitals:   03/01/20 1745 03/01/20 1800  BP: (!) 154/73 (!) 141/74  Pulse: 65 61  Resp: 17 12  Temp:    SpO2: 98% 99%    Last Pain:  Vitals:   03/01/20 1745  TempSrc:   PainSc: 5                  Akeyla Molden COKER

## 2020-03-01 NOTE — Anesthesia Preprocedure Evaluation (Addendum)
Anesthesia Evaluation  Patient identified by MRN, date of birth, ID band Patient awake    Reviewed: Allergy & Precautions, NPO status , Patient's Chart, lab work & pertinent test results  Airway Mallampati: II  TM Distance: >3 FB Neck ROM: Full    Dental  (+) Teeth Intact, Dental Advisory Given   Pulmonary    breath sounds clear to auscultation       Cardiovascular hypertension,  Rhythm:Regular Rate:Normal     Neuro/Psych    GI/Hepatic   Endo/Other    Renal/GU      Musculoskeletal   Abdominal (+) + obese,   Peds  Hematology   Anesthesia Other Findings   Reproductive/Obstetrics                             Anesthesia Physical Anesthesia Plan  ASA: III  Anesthesia Plan: General   Post-op Pain Management:    Induction: Intravenous  PONV Risk Score and Plan: Ondansetron and Dexamethasone  Airway Management Planned: Oral ETT  Additional Equipment:   Intra-op Plan:   Post-operative Plan: Extubation in OR  Informed Consent: I have reviewed the patients History and Physical, chart, labs and discussed the procedure including the risks, benefits and alternatives for the proposed anesthesia with the patient or authorized representative who has indicated his/her understanding and acceptance.     Dental advisory given  Plan Discussed with: CRNA and Anesthesiologist  Anesthesia Plan Comments:         Anesthesia Quick Evaluation

## 2020-03-01 NOTE — Transfer of Care (Signed)
Immediate Anesthesia Transfer of Care Note  Patient: Cynthia Farrell  Procedure(s) Performed: Left Lumbar One-Two Microdiscectomy (Left Spine Lumbar)  Patient Location: PACU  Anesthesia Type:General  Level of Consciousness: awake, alert  and oriented  Airway & Oxygen Therapy: Patient Spontanous Breathing and Patient connected to nasal cannula oxygen  Post-op Assessment: Report given to RN, Post -op Vital signs reviewed and stable and Patient moving all extremities X 4  Post vital signs: Reviewed and stable  Last Vitals:  Vitals Value Taken Time  BP 142/69 03/01/20 1645  Temp    Pulse 63 03/01/20 1646  Resp 29 03/01/20 1646  SpO2 100 % 03/01/20 1646  Vitals shown include unvalidated device data.  Last Pain:  Vitals:   03/01/20 1332  TempSrc:   PainSc: 4       Patients Stated Pain Goal: 2 (96/72/89 7915)  Complications: No complications documented.

## 2020-03-01 NOTE — H&P (Signed)
Patient ID:   [id number] Patient: Cynthia Farrell  Date of Birth: [birth year] Visit Type: Office Visit   Date: 02/21/2020 03:45 PM Provider: Marchia Meiers. Vertell Limber MD   This 74 year old female presents for back pain.  HISTORY OF PRESENT ILLNESS:  1.  back pain  74, 74 year old retired female, visits for evaluation.  Patient reports her left lumbar, left buttock, and left leg pain since May.  She notes symptoms initially began with left lumbar and left groin pain after riding a lawn mower.   Norco 10/325 taken p.r.n. Soma 250 mg t.i.d.  Gabapentin 300 mg 1-2/day Ketorolac 10 mg b.i.d. Ended this morning  IM injections, massage therapy, acupuncture, chiropractic treatments offered temporary relief   History:  Arthritis, hypertension, breast cancer, fibromyalgia, heart murmur with mitral valve prolapse (Dr. Einar Gip)  Surgical history:  Appendectomy 1956, tonsillectomy 1968, right breast biopsy 1972, laparoscopy 1975, D and C miscarriage 1976, right breast mastectomy and implant 1986, D and C hysteroscopy 1995, hysterectomy oophorectomy 1996, left breast biopsy 1996, left breast lumpectomy 1999, cholecystectomy and liver biopsy, right thyroid biopsy 1999, 2nd thyroid biopsy 2000  Lumbar MRI and x-ray on canopy    The patient has a lumbar MRI from 02/15/2020 which shows retrolisthesis of L2-1 on L2 with degenerative changes at this level with a large left sided disc herniation at the L1-2 level with significant nerve root compression and canal stenosis          PAST MEDICAL/SURGICAL HISTORY:   (Detailed)       PAST MEDICAL HISTORY, SURGICAL HISTORY, FAMILY HISTORY, SOCIAL HISTORY AND REVIEW OF SYSTEMS I have reviewed the patient's past medical, surgical, family and social history as well as the comprehensive review of systems as included on the [facility] history form dated 02/18/2020, which I have signed.  Family History:  (Detailed)   Social  History:  (Detailed) Tobacco use reviewed. Preferred language is [place].   Tobacco use status: Current non-smoker. Smoking status: Never smoker.  SMOKING STATUS Type Smoking Status Usage Per Day Years Used Total Pack Years   Never smoker          MEDICATIONS: (added, continued or stopped this visit) Started Medication Directions Instruction Stopped   Ambien 10 mg tablet take 1 tablet by oral route  every day at bedtime     aspirin 81 mg tablet,delayed release take 1 tablet by oral route  every day     biotin 1,000 mcg chewable tablet      Calcium 500  500 mg calcium (1,250 mg) tablet take 1 tablet by oral route  every day     carisoprodol 250 mg tablet take 1 tablet by oral route 3 times every day before meals and at bedtime     diltiazem ER 90 mg capsule,extended release 12 hr take 1 capsule by oral route 2 times every day     fluvastatin 20 mg capsule take 1 capsule by oral route  every day     folic acid 500 mcg tablet take 1 tablet by oral route  every day     gabapentin 300 mg capsule take 1 capsule by oral route 3 times every day     hydrocodone 10 mg-acetaminophen 325 mg tablet take 1 tablet by oral route  every 4 - 6 hours as needed for pain     ketorolac 10 mg tablet take 1 tablet by oral route  every 6 hours as needed for up to 5 days total  use     leflunomide 20 mg tablet take 1 tablet by oral route 2 times every day     losartan 25 mg tablet take 1 tablet by oral route  every day     sulfasalazine 500 mg tablet take 1 tablet by oral route 2 times every day after meals     vitamin b12      Vitamin D2 1,250 mcg (50,000 unit) capsule take 1 capsule by oral route 2 times every week       ALLERGIES: Ingredient Reaction Medication Name Comment  NO KNOWN ALLERGIES     No known allergies.   REVIEW OF SYSTEMS   See scanned patient registration form, dated 02/18/2020, signed and dated on 02/24/2020  Review of Systems Details System Neg/Pos Details  Constitutional  Negative Chills, Fatigue, Fever, Malaise, Night sweats, Weight gain and Weight loss.  ENMT Negative Ear drainage, Hearing loss, Nasal drainage, Otalgia, Sinus pressure and Sore throat.  Eyes Negative Eye discharge, Eye pain and Vision changes.  Respiratory Negative Chronic cough, Cough, Dyspnea, Known TB exposure and Wheezing.  Cardio Negative Chest pain, Claudication, Edema and Irregular heartbeat/palpitations.  GI Negative Abdominal pain, Blood in stool, Change in stool pattern, Constipation, Decreased appetite, Diarrhea, Heartburn, Nausea and Vomiting.  GU Negative Dysuria, Hematuria, Polyuria (Genitourinary), Urinary frequency, Urinary incontinence and Urinary retention.  Endocrine Negative Cold intolerance, Heat intolerance, Polydipsia and Polyphagia.  Neuro Positive Gait disturbance.  Psych Negative Anxiety, Depression and Insomnia.  Integumentary Negative Brittle hair, Brittle nails, Change in shape/size of mole(s), Hair loss, Hirsutism, Hives, Pruritus, Rash and Skin lesion.  MS Positive Back pain.  Hema/Lymph Negative Easy bleeding, Easy bruising and Lymphadenopathy.  Allergic/Immuno Negative Contact allergy, Environmental allergies, Food allergies and Seasonal allergies.  Reproductive Negative Breast discharge, Breast lumps, Dysmenorrhea, Dyspareunia, History of abnormal PAP smear, Hot flashes, Irregular menses and Vaginal discharge.   PHYSICAL EXAM:   Vitals Date Temp F BP Pulse Ht In Wt Lb BMI BSA Pain Score  02/21/2020  141/84 65 62 190 34.75  1/10    PHYSICAL EXAM Details General Level of Distress: no acute distress Overall Appearance: normal  Head and Face  Right Left  Fundoscopic Exam:  normal normal    Cardiovascular Cardiac: regular rate and rhythm without murmur  Right Left  Carotid Pulses: normal normal  Respiratory Lungs: clear to auscultation  Neurological Orientation: normal Recent and Remote Memory: normal Attention Span and Concentration:    normal Language: normal Fund of Knowledge: normal  Right Left Sensation: normal normal Upper Extremity Coordination: normal normal  Lower Extremity Coordination: normal normal  Musculoskeletal Gait and Station: normal  Right Left Upper Extremity Muscle Strength: normal normal Lower Extremity Muscle Strength: normal normal Upper Extremity Muscle Tone:  normal normal Lower Extremity Muscle Tone: normal normal   Motor Strength Upper and lower extremity motor strength was tested in the clinically pertinent muscles. Any abnormal findings will be noted below.   Right Left Hip Flexor:  4/5   Deep Tendon Reflexes  Right Left Biceps: normal normal Triceps: normal normal Brachioradialis: normal normal Patellar: normal normal Achilles: normal normal  Sensory Sensation was tested at L1 to S1.   Cranial Nerves II. Optic Nerve/Visual Fields: normal III. Oculomotor: normal IV. Trochlear: normal V. Trigeminal: normal VI. Abducens: normal VII. Facial: normal VIII. Acoustic/Vestibular: normal IX. Glossopharyngeal: normal X. Vagus: normal XI. Spinal Accessory: normal XII. Hypoglossal: normal  Motor and other Tests Lhermittes: negative Rhomberg: negative Pronator drift: absent     Right Left  Hoffman's: normal normal Clonus: normal normal Babinski: normal normal SLR: negative positive at 20 degrees Patrick's Corky Sox): negative negative Toe Walk: normal normal Toe Lift: normal normal Heel Walk: normal normal SI Joint: nontender nontender      IMPRESSION:    the patient has left hip flexor weakness at 4/5 and severe left leg and low back pain.  She has a large disc herniation at L1-2 on left  PLAN:   I have recommended proceeding with left L1-2 microdiskectomy at [facility] in the near future.  She is going to get cardiac clearance from her cardiologist.  Orders: Diagnostic Procedures: Assessment Procedure  M54.16 Lumbar Spine- AP/Lat/Flex/Ex   Instruction(s)/Education: Assessment Instruction  I10 Lifestyle education  Z68.34 Lifestyle education regarding diet   Completed Orders (this encounter) Order Details Reason Side Interpretation Result Initial Treatment Date Region  Lifestyle education Patient will follow up with Primary care physician.        Lifestyle education regarding diet Encouraged patient to eat well balanced diet.        Lumbar Spine- AP/Lat/Flex/Ex      02/21/2020 All Levels to All Levels   Assessment/Plan   # Detail Type Description   1. Assessment DDD (degenerative disc disease), lumbar (M51.36).       2. Assessment Low back pain, unspecified back pain laterality, with sciatica presence unspecified (M54.5).       3. Assessment Spondylolisthesis, lumbar region (M43.16).       4. Assessment Lumbar radiculopathy (M54.16).       5. Assessment Herniated nucleus pulposus, lumbar (M51.26).       6. Assessment Essential (primary) hypertension (I10).       7. Assessment Body mass index (BMI) 34.0-34.9, adult (Z68.34).   Plan Orders Today's instructions / counseling include(s) Lifestyle education regarding diet. Clinical information/comments: Encouraged patient to eat well balanced diet.         Pain Management Plan Pain Scale: 1/10. Method: Numeric Pain Intensity Scale. Location: back. Onset: 02/08/2020. Duration: varies. Quality: discomforting. Pain management follow-up plan of care: Patient will continue medication management..              Provider:  Marchia Meiers. Vertell Limber MD  02/25/2020 04:07 PM    Dictation edited by: Marchia Meiers. Vertell Limber    CC Providers: Jani Gravel [facility] [address] [place],  [place]  [address]-   Latori Beggs MD  [address] [place], [place] [address]               Electronically signed by Marchia Meiers. Vertell Limber MD on 02/25/2020 04:07 PM

## 2020-03-01 NOTE — Op Note (Signed)
03/01/2020  4:23 PM  PATIENT:  Cynthia Farrell  74 y.o. 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, spondylolisthesis, stenosis, spondylosis, lumbago, radiculopathy  PROCEDURE:  Procedure(s) with comments: Left Lumbar One-Two Microdiscectomy (Left) - Left Lumbar One-Two Microdiscectomy  SURGEON:  Surgeon(s) and Role:    Erline Levine, MD - Primary    * Vallarie Mare, MD - Assisting  PHYSICIAN ASSISTANT:   ASSISTANTS: [place], RN   Glenford Peers, NP   ANESTHESIA:   general  EBL: Minimal  BLOOD ADMINISTERED:none  DRAINS: none   LOCAL MEDICATIONS USED:  MARCAINE    and LIDOCAINE   SPECIMEN:  No Specimen  DISPOSITION OF SPECIMEN:  N/A  COUNTS:  YES  TOURNIQUET:  * No tourniquets in log *  DICTATION: DICTATION: Patient has a large L 12 disc rupture on the left with significant left leg weakness. It was elected to take her to surgery for left L 12 microdiscectomy.  Procedure: Patient was brought to the operating room and following the smooth and uncomplicated induction of general endotracheal anesthesia she was placed in a prone position on the Wilson frame. Low back was prepped and draped in the usual sterile fashion with betadine scrub and DuraPrep. Preoperative localizing X ray was obtained with a spinal needle.  Area of planned incision was infiltrated with local lidocaine. Incision was made in the midline and carried to the lumbodorsal fascia which was incised on the left side of midline. Subperiosteal dissection was performed exposing what was felt to be L 12 level. Intraoperative x-ray demonstrated marker probe at L 12.  A hemi-semi-laminectomy of L 1 was performed a high-speed drill and completed with Kerrison rongeurs and a generous foraminotomy was performed overlying the superior aspect of the L 2  lamina. Ligamentum flavum was detached and removed in a piecemeal fashion and the L 2 nerve root was decompressed laterally with removal of the superior aspect of the facet and ligamentum causing nerve root compression. The microscope was brought into the field and the L 2 nerve root and thecal sac were mobilized medially. This exposed a large amount of soft disc material and multiple free fragments of herniated disc material. Multiple fragments were removed. The lateral recess and foraminal region were also decompressed.  The interspace itself was not entered.  At this point it was felt that all neural elements were well decompressed and there was no evidence of residual loose disc material. The interspace was then irrigated with saline and no additional disc material was mobilized. Hemostasis was assured withSurgifoam and the interspace was irrigated with Depo-Medrol and fentanyl. The lumbodorsal fascia was closed with 0 Vicryl sutures the subcutaneous tissues reapproximated 2-0 Vicryl inverted sutures and the skin edges were reapproximated with 3-0 Vicryl subcuticular stitch. The wound is dressed with Dermabond and an occlusive dressing. Patient was extubated in the operating room and taken to recovery in stable and satisfactory condition having tolerated her operation well counts were correct at the end of the case.   PLAN OF CARE: Admit for overnight observation  PATIENT DISPOSITION:  PACU - hemodynamically stable.   Delay start of Pharmacological VTE agent (>24hrs) due to surgical blood loss or risk of bleeding: yes

## 2020-03-02 ENCOUNTER — Encounter (HOSPITAL_COMMUNITY): Payer: Self-pay | Admitting: Neurosurgery

## 2020-03-02 DIAGNOSIS — Z791 Long term (current) use of non-steroidal anti-inflammatories (NSAID): Secondary | ICD-10-CM | POA: Diagnosis not present

## 2020-03-02 DIAGNOSIS — M797 Fibromyalgia: Secondary | ICD-10-CM | POA: Diagnosis not present

## 2020-03-02 DIAGNOSIS — E669 Obesity, unspecified: Secondary | ICD-10-CM | POA: Diagnosis not present

## 2020-03-02 DIAGNOSIS — M4726 Other spondylosis with radiculopathy, lumbar region: Secondary | ICD-10-CM | POA: Diagnosis not present

## 2020-03-02 DIAGNOSIS — Z7982 Long term (current) use of aspirin: Secondary | ICD-10-CM | POA: Diagnosis not present

## 2020-03-02 DIAGNOSIS — I341 Nonrheumatic mitral (valve) prolapse: Secondary | ICD-10-CM | POA: Diagnosis not present

## 2020-03-02 DIAGNOSIS — Z6834 Body mass index (BMI) 34.0-34.9, adult: Secondary | ICD-10-CM | POA: Diagnosis not present

## 2020-03-02 DIAGNOSIS — M5116 Intervertebral disc disorders with radiculopathy, lumbar region: Secondary | ICD-10-CM | POA: Diagnosis not present

## 2020-03-02 DIAGNOSIS — M4316 Spondylolisthesis, lumbar region: Secondary | ICD-10-CM | POA: Diagnosis not present

## 2020-03-02 DIAGNOSIS — M48061 Spinal stenosis, lumbar region without neurogenic claudication: Secondary | ICD-10-CM | POA: Diagnosis not present

## 2020-03-02 DIAGNOSIS — Z79899 Other long term (current) drug therapy: Secondary | ICD-10-CM | POA: Diagnosis not present

## 2020-03-02 DIAGNOSIS — I10 Essential (primary) hypertension: Secondary | ICD-10-CM | POA: Diagnosis not present

## 2020-03-02 DIAGNOSIS — Z853 Personal history of malignant neoplasm of breast: Secondary | ICD-10-CM | POA: Diagnosis not present

## 2020-03-02 MED ORDER — OXYCODONE HCL 5 MG PO TABS: 5.0000 mg | ORAL_TABLET | Freq: Four times a day (QID) | ORAL | 0 refills | Status: AC | PRN

## 2020-03-02 MED ORDER — TIZANIDINE HCL 4 MG PO TABS
4.0000 mg | ORAL_TABLET | Freq: Four times a day (QID) | ORAL | 0 refills | Status: DC | PRN
Start: 2020-03-02 — End: 2021-03-31

## 2020-03-02 NOTE — Progress Notes (Signed)
Patient is discharged from room 3C07 at this time. Alert and in stable condition. IV site d/c'd and instructions read to patient and family with understanding verbalized and all questions answered. Left unit via wheelchair with all belongings at side.

## 2020-03-02 NOTE — Evaluation (Signed)
Occupational Therapy Evaluation Patient Details Name: Cynthia Farrell MRN: [id number] DOB: [birth year] Today's Date: 03/02/2020    History of Present Illness Pt is a 74 y/o female s/p L L1-2 microdiscectomy. PMH includes: arthritis, HTN, fibromyalgia, breast cancer, heart murmur with mitral valve prolapse.   Clinical Impression   PTA patient independent with increased assist recently due to pain.  Admitted for above and presenting near baseline at supervision level for ADLs, transfers and in room mobility.  Educated on back precautions, recommendations, safety, ADL compensatory techniques and DME.  Pt using figure 4 techniques for LB ADLs without assist.  Patient has good support at home, follows precautions without cueing, and plans to self purchase [place].  Based on performance today, no further OT needs identified and OT will sign off. Thank you for this referral!     Follow Up Recommendations  No OT follow up    Equipment Recommendations  Tub/shower seat (pt plans to purchase on her own)    Recommendations for Other Services       Precautions / Restrictions Precautions Precautions: Back Precaution Booklet Issued: Yes (comment) Precaution Comments: reviewed all precautions with pt throughout Restrictions Weight Bearing Restrictions: No      Mobility Bed Mobility               General bed mobility comments: pt OOB in bathroom upon arrival; reviewed log roll technique verbally with handout   Transfers Overall transfer level: Needs assistance Equipment used: None Transfers: Sit to/from Stand Sit to Stand: Supervision         General transfer comment: no instability or LOB    Balance Overall balance assessment: Needs assistance Sitting-balance support: Feet supported Sitting balance-Leahy Scale: Good     Standing balance support: No upper extremity supported Standing balance-Leahy Scale: Good                             ADL either performed or  assessed with clinical judgement   ADL Overall ADL's : Needs assistance/impaired     Grooming: Modified independent;Standing   Upper Body Bathing: Supervision/ safety;Sitting   Lower Body Bathing: Supervison/ safety;Sit to/from stand   Upper Body Dressing : Supervision/safety;Sitting   Lower Body Dressing: Supervision/safety;Sit to/from stand Lower Body Dressing Details (indicate cue type and reason): figure 4 technique with supervision, discussed compensatory techniques  Toilet Transfer: Ambulation;Modified Independent     Toileting - Clothing Manipulation Details (indicate cue type and reason): discussed techniques for toileting, pt verbalized understanding      Functional mobility during ADLs: Supervision/safety General ADL Comments: pt educated on back precautions, ADL compensatory techniques, recommendations, safety and DME      Vision         Perception     Praxis      Pertinent Vitals/Pain Pain Assessment: Faces Faces Pain Scale: Hurts a little bit Pain Location: back, incision site Pain Descriptors / Indicators: Sore Pain Intervention(s): Monitored during session;Repositioned     Hand Dominance     Extremity/Trunk Assessment Upper Extremity Assessment Upper Extremity Assessment: Defer to OT evaluation;Overall WFL for tasks assessed   Lower Extremity Assessment Lower Extremity Assessment: Overall WFL for tasks assessed   Cervical / Trunk Assessment Cervical / Trunk Assessment: Other exceptions Cervical / Trunk Exceptions: s/p lumbar sx   Communication Communication Communication: No difficulties   Cognition Arousal/Alertness: Awake/alert Behavior During Therapy: WFL for tasks assessed/performed Overall Cognitive Status: Within Functional Limits for tasks assessed  General Comments       Exercises     Shoulder Instructions      Home Living Family/patient expects to be discharged to:: Private  residence Living Arrangements: Spouse/significant other Available Help at Discharge: Family;Available 24 hours/day Type of Home: House Home Access: Stairs to enter [employer] of Steps: 1   Home Layout: One level     Bathroom Shower/Tub: Teacher, early years/pre: Standard     Home Equipment: Toilet riser;Hand held Tourist information centre manager - 2 wheels Adaptive Equipment: Reacher        Prior Functioning/Environment Level of Independence: Needs assistance  Gait / Transfers Assistance Needed: was having great difficulty ambulating secondary to pain ADL's / Homemaking Assistance Needed: needed husband's assistance over the past 3 weeks for ADLs            OT Problem List: Pain;Decreased knowledge of precautions;Decreased knowledge of use of DME or AE      OT Treatment/Interventions:      OT Goals(Current goals can be found in the care plan section) Acute Rehab OT Goals Patient Stated Goal: "home today" OT Goal Formulation: With patient  OT Frequency:     Barriers to D/C:            Co-evaluation              AM-PAC OT "6 Clicks" Daily Activity     Outcome Measure Help from another person eating meals?: None Help from another person taking care of personal grooming?: None Help from another person toileting, which includes using toliet, bedpan, or urinal?: None Help from another person bathing (including washing, rinsing, drying)?: None Help from another person to put on and taking off regular upper body clothing?: None Help from another person to put on and taking off regular lower body clothing?: None 6 Click Score: 24   End of Session Nurse Communication: Mobility status  Activity Tolerance: Patient tolerated treatment well Patient left: with call bell/phone within reach;Other (comment) (sitting eOB)  OT Visit Diagnosis: Pain Pain - part of body:  (back)                Time: 4944-9675 OT Time Calculation (min): 22 min Charges:  OT  General Charges $OT Visit: 1 Visit OT Evaluation $OT Eval Low Complexity: 1 Low  Jolaine Artist, OT Acute Rehabilitation Services Pager [phone] Office [phone]   Delight Stare 03/02/2020, 9:52 AM

## 2020-03-02 NOTE — Evaluation (Signed)
Physical Therapy Evaluation Patient Details Name: Cynthia Farrell MRN: [id number] DOB: 02/16/1946 Today's Date: 03/02/2020   History of Present Illness  Pt is a 74 y/o female s/p L L1-2 microdiscectomy. PMH includes: arthritis, HTN, fibromyalgia, breast cancer, heart murmur with mitral valve prolapse.    Clinical Impression  Pt presented standing in bathroom washing up at sink and willing to participate in therapy session. Prior to admission, pt reported that over the past three weeks she has had significant difficulty walking and has required assistance from her husband for ADLs. Pt lives with her spouse in a single level home with one step to enter. At the time of evaluation, pt overall moving very well without the use of an AD or need for physical assistance. She tolerated hallway ambulation without LOB or difficulties. PT provided pt education re: back precautions with handout provided, car transfers and a generalized walking program for pt to initiate upon d/c home. No further acute PT needs identified at this time. PT signing off.     Follow Up Recommendations No PT follow up    Equipment Recommendations  3in1 (PT)    Recommendations for Other Services       Precautions / Restrictions Precautions Precautions: Back Precaution Booklet Issued: Yes (comment) Precaution Comments: reviewed all precautions with pt throughout Restrictions Weight Bearing Restrictions: No      Mobility  Bed Mobility               General bed mobility comments: pt OOB in bathroom upon arrival; reviewed log roll technique verbally with handout   Transfers Overall transfer level: Needs assistance Equipment used: None Transfers: Sit to/from Stand Sit to Stand: Supervision         General transfer comment: no instability or LOB  Ambulation/Gait Ambulation/Gait assistance: Min guard;Supervision Gait Distance (Feet): 500 Feet Assistive device: None Gait Pattern/deviations: Step-through  pattern;Decreased stride length Gait velocity: decreased   General Gait Details: pt steady overall with hallway ambulation without use of an AD or need for physical assistance, no LOB or instability  Stairs            Wheelchair Mobility    Modified Rankin (Stroke Patients Only)       Balance Overall balance assessment: Needs assistance Sitting-balance support: Feet supported Sitting balance-Leahy Scale: Good     Standing balance support: No upper extremity supported Standing balance-Leahy Scale: Good                               Pertinent Vitals/Pain Pain Assessment: Faces Faces Pain Scale: Hurts a little bit Pain Location: back, incision site Pain Descriptors / Indicators: Sore Pain Intervention(s): Monitored during session;Repositioned    Home Living Family/patient expects to be discharged to:: Private residence Living Arrangements: Spouse/significant other Available Help at Discharge: Family;Available 24 hours/day Type of Home: House Home Access: Stairs to enter   [employer] of Steps: 1 Home Layout: One level Home Equipment: Toilet riser;Hand held Tourist information centre manager - 2 wheels      Prior Function Level of Independence: Needs assistance   Gait / Transfers Assistance Needed: was having great difficulty ambulating secondary to pain  ADL's / Homemaking Assistance Needed: needed husband's assistance over the past 3 weeks for ADLs        Hand Dominance        Extremity/Trunk Assessment   Upper Extremity Assessment Upper Extremity Assessment: Defer to OT evaluation;Overall [facility] for tasks assessed  Lower Extremity Assessment Lower Extremity Assessment: Overall WFL for tasks assessed    Cervical / Trunk Assessment Cervical / Trunk Assessment: Other exceptions Cervical / Trunk Exceptions: s/p lumbar sx  Communication   Communication: No difficulties  Cognition Arousal/Alertness: Awake/alert Behavior During Therapy: WFL  for tasks assessed/performed Overall Cognitive Status: Within Functional Limits for tasks assessed                                        General Comments      Exercises     Assessment/Plan    PT Assessment Patent does not need any further PT services  PT Problem List         PT Treatment Interventions      PT Goals (Current goals can be found in the Care Plan section)  Acute Rehab PT Goals Patient Stated Goal: "home today" PT Goal Formulation: All assessment and education complete, DC therapy    Frequency     Barriers to discharge        Co-evaluation               AM-PAC PT "6 Clicks" Mobility  Outcome Measure Help needed turning from your back to your side while in a flat bed without using bedrails?: None Help needed moving from lying on your back to sitting on the side of a flat bed without using bedrails?: None Help needed moving to and from a bed to a chair (including a wheelchair)?: None Help needed standing up from a chair using your arms (e.g., wheelchair or bedside chair)?: None Help needed to walk in hospital room?: None Help needed climbing 3-5 steps with a railing? : A Little 6 Click Score: 23    End of Session   Activity Tolerance: Patient tolerated treatment well Patient left: in bed;with call bell/phone within reach Nurse Communication: Mobility status PT Visit Diagnosis: Other abnormalities of gait and mobility (R26.89)    Time: 2122-4825 PT Time Calculation (min) (ACUTE ONLY): 27 min   Charges:   PT Evaluation $PT Eval Low Complexity: 1 Low PT Treatments $Gait Training: 8-22 mins        Anastasio Champion, DPT  Acute Rehabilitation Services Pager [phone] Office [place] 03/02/2020, 9:20 AM

## 2020-03-02 NOTE — Discharge Summary (Signed)
Physician Discharge Summary  Patient ID: Cynthia Farrell MRN: [id number] DOB/AGE: 74-May-1947 74 y.o.  Admit date: 03/01/2020 Discharge date: 03/02/2020  Admission Diagnoses:Herniated nucleus pulposus, Lumbar L 12 left, spondylolisthesis, stenosis, spondylosis, lumbago, radiculopathy    Discharge Diagnoses: Herniated nucleus pulposus, Lumbar L 12 left, spondylolisthesis, stenosis, spondylosis, lumbago, radiculopathy   Active Problems:   Herniated lumbar disc without myelopathy   Discharged Condition: good  Hospital Course: Cynthia Farrell was admitted and taken to the operating room for an uncomplicated lumbar discetomy at L1/2 on the left side. Post op she is voiding, ambulating, and tolerating a regular diet. Her wound is clean, dry, and without signs of infection. Her strength is improved in the left hip flexors.   Treatments: surgery: Left Lumbar One-Two Microdiscectomy (Left) - Left Lumbar One-Two Microdiscectomy     Discharge Exam: Blood pressure 137/67, pulse 94, temperature 99.8 F (37.7 C), temperature source Oral, resp. rate 19, height 5\' 2"  (1.575 m), weight 86.2 kg, SpO2 93 %. General appearance: alert, cooperative, appears stated age and no distress Neurologic: Grossly normal  Disposition: Discharge disposition: 01-Home or Self Care      Herniated nucleus pulposus, Lumbar  Allergies as of 03/02/2020   No Known Allergies     Medication List    TAKE these medications   amoxicillin 500 MG capsule Commonly known as: AMOXIL Take 2,000 mg by mouth See admin instructions. Take 4 capsules prior to having teeth cleaned or dental procedure   aspirin EC 81 MG tablet Take 81 mg by mouth daily.   Biotin 1000 MCG tablet Take 1,000 mcg by mouth daily.   carisoprodol 250 MG tablet Commonly known as: SOMA Take 250 mg by mouth every 8 (eight) hours as needed (for muscle spasms).   cyanocobalamin 2000 MCG tablet Take 500 mcg by mouth daily.   diltiazem 90 MG 12 hr  capsule Commonly known as: CARDIZEM SR TAKE 1 CAPSULE(90 MG) BY MOUTH TWICE DAILY What changed: See the new instructions.   ezetimibe 10 MG tablet Commonly known as: ZETIA Take 10 mg by mouth daily.   fluvastatin 20 MG capsule Commonly known as: LESCOL Take 20 mg by mouth daily.   folic acid 468 MCG tablet Commonly known as: FOLVITE Take 800 mcg by mouth daily.   HYDROcodone-acetaminophen 10-325 MG tablet Commonly known as: NORCO Take 1 tablet by mouth 2 (two) times daily as needed for moderate pain.   leflunomide 20 MG tablet Commonly known as: ARAVA Take 20 mg by mouth daily.   lidocaine 5 % Commonly known as: Lidoderm Place 1 patch onto the skin daily. Remove & Discard patch within 12 hours or as directed by MD   losartan 25 MG tablet Commonly known as: COZAAR Take 25 mg by mouth daily.   methylPREDNISolone 4 MG tablet Commonly known as: MEDROL Take 4 mg by mouth daily. Last dose Wednesday June, 9.   Dose pack- at 1 - 4mg  / day at this time.   oxyCODONE 5 MG immediate release tablet Commonly known as: Oxy IR/ROXICODONE Take 1-2 tablets (5-10 mg total) by mouth every 6 (six) hours as needed for up to 7 days for moderate pain or severe pain.   sulfaSALAzine 500 MG tablet Commonly known as: AZULFIDINE Take 500 mg by mouth 2 (two) times daily.   tiZANidine 4 MG tablet Commonly known as: ZANAFLEX Take 1 tablet (4 mg total) by mouth every 6 (six) hours as needed for muscle spasms.   Zegerid 40-1100 MG capsule Generic drug: omeprazole-sodium  bicarbonate Take 1 capsule by mouth daily.   zolpidem 10 MG tablet Commonly known as: AMBIEN Take 5 mg by mouth at bedtime.       Follow-up Information    Cynthia Levine, MD Follow up in 3 week(s).   Specialty: Neurosurgery Why: please call the office to make an appointment Contact information: [address] [place] [address] [phone]               Signed: Ashok Pall 03/02/2020,  10:20 AM

## 2020-03-02 NOTE — Care Management (Signed)
Patient to d/c home today.  Patient requesting help with financial barriers, copays, hospital bills, and Medicaid application.  Financial planning is not available today due to weekend hours.  Referral made to [employer] for outpatient assistance.  Information placed on AVS.

## 2020-03-05 ENCOUNTER — Other Ambulatory Visit: Payer: Self-pay | Admitting: *Deleted

## 2020-03-05 NOTE — Patient Outreach (Signed)
[facility]) Care Management  03/05/2020  BROOKLYNNE PEREIDA 11-17-1945 [id number]   Initial telephone outreach, s/p lumbar discectomy.  Pt was discharged on Saturday, 03/02/20. Medical Hx includes: CVA, MVP, HTN, AFIB, Aortic Stenosis, Chronic Venous Insufficiency, GERD, OA, Herniated lumbar disc, Vertigo, Hyperlipidemia, Fibromyalgia, Hx Breast Ca, + others.  Pt agrees to participate in [place] Management program. We will focus on surgical recovery this month and introduce new areas for education after this.  She is currently in moderate amount of pain from surgery but has not taken a pain medication in 7 hours. She states her pain level is 7/10 at present. She reports with pain med at peak she would say 3/10.  Patient was recently discharged from hospital and all medications have been reviewed. Outpatient Encounter Medications as of 03/05/2020  Medication Sig Note  . amoxicillin (AMOXIL) 500 MG capsule Take 2,000 mg by mouth See admin instructions. Take 4 capsules prior to having teeth cleaned or dental procedure    . aspirin EC 81 MG tablet Take 81 mg by mouth daily.   . Biotin 1000 MCG tablet Take 1,000 mcg by mouth daily.   . carisoprodol (SOMA) 250 MG tablet Take 250 mg by mouth every 8 (eight) hours as needed (for muscle spasms).    . cyanocobalamin 2000 MCG tablet Take 500 mcg by mouth daily.    Marland Kitchen diltiazem (CARDIZEM SR) 90 MG 12 hr capsule TAKE 1 CAPSULE(90 MG) BY MOUTH TWICE DAILY (Patient taking differently: Take 90 mg by mouth 2 (two) times daily. )   . ezetimibe (ZETIA) 10 MG tablet Take 10 mg by mouth daily.   . fluvastatin (LESCOL) 20 MG capsule Take 20 mg by mouth daily.   . folic acid (FOLVITE) 882 MCG tablet Take 800 mcg by mouth daily.   Marland Kitchen HYDROcodone-acetaminophen (NORCO) 10-325 MG tablet Take 1 tablet by mouth 2 (two) times daily as needed for moderate pain.    Marland Kitchen leflunomide (ARAVA) 20 MG tablet Take 20 mg by mouth daily.   Marland Kitchen lidocaine (LIDODERM) 5 %  Place 1 patch onto the skin daily. Remove & Discard patch within 12 hours or as directed by MD 02/15/2020: Patient took it off earlier today (02/14/20)  . losartan (COZAAR) 25 MG tablet Take 25 mg by mouth daily.   . methylPREDNISolone (MEDROL) 4 MG tablet Take 4 mg by mouth daily. Last dose Wednesday June, 9.   Dose pack- at 1 - 4mg  / day at this time.   Marland Kitchen omeprazole-sodium bicarbonate (ZEGERID) 40-1100 MG per capsule Take 1 capsule by mouth daily.    Marland Kitchen oxyCODONE (OXY IR/ROXICODONE) 5 MG immediate release tablet Take 1-2 tablets (5-10 mg total) by mouth every 6 (six) hours as needed for up to 7 days for moderate pain or severe pain.   Marland Kitchen sulfaSALAzine (AZULFIDINE) 500 MG tablet Take 500 mg by mouth 2 (two) times daily.   Marland Kitchen tiZANidine (ZANAFLEX) 4 MG tablet Take 1 tablet (4 mg total) by mouth every 6 (six) hours as needed for muscle spasms.   Marland Kitchen zolpidem (AMBIEN) 10 MG tablet Take 5 mg by mouth at bedtime.     No facility-administered encounter medications on file as of 03/05/2020.   Will f/u in one week.  [name]. [name], MSN, [facility] Gerontological Nurse Practitioner [facility] Care Management [phone]

## 2020-03-14 ENCOUNTER — Other Ambulatory Visit: Payer: Self-pay | Admitting: *Deleted

## 2020-03-14 NOTE — Patient Outreach (Addendum)
[facility]) Care Management  03/14/2020  Cynthia Farrell Sep 01, 1946 [id number]  Transition of care call #2:  Cynthia Farrell is making slow progress. Her pain level has decreased to 6/10 from 7/10. She is weaning her pain medication down, mostly taking only 1 hydrocodone/apap at a time, seldom uses an oxycodone. She is walking. Bowels moving routinely. She will not start PT until after her followup visit.  Completed the initiial assessment components today.  Patient was recently discharged from hospital and all medications have been reviewed. Outpatient Encounter Medications as of 03/14/2020  Medication Sig Note  . amoxicillin (AMOXIL) 500 MG capsule Take 2,000 mg by mouth See admin instructions. Take 4 capsules prior to having teeth cleaned or dental procedure    . aspirin EC 81 MG tablet Take 81 mg by mouth daily.   . Biotin 1000 MCG tablet Take 1,000 mcg by mouth daily.   . carisoprodol (SOMA) 250 MG tablet Take 250 mg by mouth every 8 (eight) hours as needed (for muscle spasms).    . cyanocobalamin 2000 MCG tablet Take 500 mcg by mouth daily.    Marland Kitchen diltiazem (CARDIZEM SR) 90 MG 12 hr capsule TAKE 1 CAPSULE(90 MG) BY MOUTH TWICE DAILY (Patient taking differently: Take 90 mg by mouth 2 (two) times daily. )   . ezetimibe (ZETIA) 10 MG tablet Take 10 mg by mouth daily.   . fluvastatin (LESCOL) 20 MG capsule Take 20 mg by mouth daily.   . folic acid (FOLVITE) 829 MCG tablet Take 800 mcg by mouth daily.   Marland Kitchen HYDROcodone-acetaminophen (NORCO) 10-325 MG tablet Take 1 tablet by mouth 2 (two) times daily as needed for moderate pain.    Marland Kitchen leflunomide (ARAVA) 20 MG tablet Take 20 mg by mouth daily.   Marland Kitchen lidocaine (LIDODERM) 5 % Place 1 patch onto the skin daily. Remove & Discard patch within 12 hours or as directed by MD 02/15/2020: Patient took it off earlier today (02/14/20)  . losartan (COZAAR) 25 MG tablet Take 25 mg by mouth daily.   . methylPREDNISolone (MEDROL) 4 MG tablet Take 4 mg  by mouth daily. Last dose Wednesday June, 9.   Dose pack- at 1 - 4mg  / day at this time.   Marland Kitchen omeprazole-sodium bicarbonate (ZEGERID) 40-1100 MG per capsule Take 1 capsule by mouth daily.    Marland Kitchen sulfaSALAzine (AZULFIDINE) 500 MG tablet Take 500 mg by mouth 2 (two) times daily.   Marland Kitchen tiZANidine (ZANAFLEX) 4 MG tablet Take 1 tablet (4 mg total) by mouth every 6 (six) hours as needed for muscle spasms.   Marland Kitchen zolpidem (AMBIEN) 10 MG tablet Take 5 mg by mouth at bedtime.     No facility-administered encounter medications on file as of 03/14/2020.   Fall Risk  03/15/2020  Falls in the past year? 1  Number falls in past yr: 0  Injury with Fall? 0  Risk for fall due to : History of fall(s);Impaired mobility;Medication side effect  Follow up Falls prevention discussed;Falls evaluation completed   Depression screen [facility] 2/9 03/15/2020  Decreased Interest 0  Down, Depressed, Hopeless 0  PHQ - 2 Score 0   THN CM Care Plan Problem One     Most Recent Value  Care Plan Problem One Pain  Role Documenting the Problem One Care Management [facility] for Problem One Active  THN Long Term Goal  Pt will report improving pain over the next 60 days per pt report (Current level 7/10).  THN  Long Term Goal Start Date 03/06/20  THN CM Short Term Goal #1  Pt to keep a pain log and notate when and what pt was doing at time of worse pain and what she did to reduce or relieve pain over the next 30 days.  THN CM Short Term Goal #1 Start Date 03/06/20     I will call her again in one week.  [name]. [name], MSN, [facility] Gerontological Nurse Practitioner [facility] Care Management [phone]

## 2020-03-15 ENCOUNTER — Encounter: Payer: Self-pay | Admitting: *Deleted

## 2020-03-21 ENCOUNTER — Ambulatory Visit: Payer: Self-pay | Admitting: *Deleted

## 2020-03-27 DIAGNOSIS — I10 Essential (primary) hypertension: Secondary | ICD-10-CM | POA: Diagnosis not present

## 2020-03-27 DIAGNOSIS — E7800 Pure hypercholesterolemia, unspecified: Secondary | ICD-10-CM | POA: Diagnosis not present

## 2020-03-27 DIAGNOSIS — Z79899 Other long term (current) drug therapy: Secondary | ICD-10-CM | POA: Diagnosis not present

## 2020-03-27 DIAGNOSIS — E559 Vitamin D deficiency, unspecified: Secondary | ICD-10-CM | POA: Diagnosis not present

## 2020-03-28 ENCOUNTER — Other Ambulatory Visit: Payer: Self-pay | Admitting: *Deleted

## 2020-03-28 ENCOUNTER — Ambulatory Visit: Payer: Medicare Other | Admitting: Cardiology

## 2020-03-28 ENCOUNTER — Other Ambulatory Visit: Payer: Self-pay

## 2020-03-28 ENCOUNTER — Encounter: Payer: Self-pay | Admitting: Cardiology

## 2020-03-28 VITALS — BP 127/67 | HR 82 | Ht 62.0 in | Wt 194.0 lb

## 2020-03-28 DIAGNOSIS — R0600 Dyspnea, unspecified: Secondary | ICD-10-CM

## 2020-03-28 DIAGNOSIS — I10 Essential (primary) hypertension: Secondary | ICD-10-CM

## 2020-03-28 DIAGNOSIS — R0609 Other forms of dyspnea: Secondary | ICD-10-CM | POA: Diagnosis not present

## 2020-03-28 DIAGNOSIS — I5032 Chronic diastolic (congestive) heart failure: Secondary | ICD-10-CM | POA: Diagnosis not present

## 2020-03-28 NOTE — Patient Outreach (Signed)
[facility]) Care Management  03/28/2020  Cynthia Farrell 02-24-1946 [id number]  Late entry for call made on 03/26/20, unable to document related to no Internet connection.  [name] says she is doing well, improving daily. Pain level is diminishing, reported 5/10 on Tuesday.  Had OV with cardiology for HTN today. Advised to continue her current regimen.  No further questions or needs today.  Will call again in one week.  [name]. [name], MSN, [facility] Gerontological Nurse Practitioner [facility] Care Management [phone]

## 2020-03-28 NOTE — Progress Notes (Signed)
Primary Physician:  Jani Gravel, MD   Patient ID: Cynthia Farrell, female    DOB: [birth year], 74 y.o.   MRN: [id number]  Subjective:    Chief Complaint  Patient presents with  . Follow-up    6 month  . Hypertension  . Near Syncope    HPI: Cynthia Farrell  is a 74 y.o. female  with mild hypertension, hyperdynamic LV, chronic palpitations (resolved with diltiazem), moderate obesity, history of rheumatoid arthritis and also osteo arthritis, fibromyalgia presents here for follow-up of recurrent episodes of near syncope that has been occurring over the past 2-3 years.  This is her 65-month office visit, fortunately she has not had any further episodes of dizziness or syncope.  States that she is doing well, recently had back surgery and has recuperated well from this about 4 weeks ago in June 2021.  Back pain is improved significantly since then.   Past Medical History:  Diagnosis Date  . Atrial fibrillation ([place])   . Breast cancer ([place])    breast   . Diverticular disease   . Dysrhythmia    a remote history in 1990's - per Dr. Irven Shelling notes.  . Esophageal stricture   . Fibromyalgia   . GERD (gastroesophageal reflux disease)   . Heart murmur   . Hemorrhoids   . Hyperlipemia   . Hypertension   . MVP (mitral valve prolapse)   . Osteoarthritis    RA  . Pneumonia   . Rheumatoid aortitis     Past Surgical History:  Procedure Laterality Date  . ABDOMINAL HYSTERECTOMY  1995  . APPENDECTOMY  1956  . BREAST BIOPSY  1972  . BREAST LUMPECTOMY Left   . [place] & 2005  . CATARACT EXTRACTION Bilateral   . CHOLECYSTECTOMY  1999  . COLONOSCOPY  02/12/2012   Procedure: COLONOSCOPY;  Surgeon: Inda Castle, MD;  Location: [facility];  Service: Endoscopy;  Laterality: N/A;  . DILATION AND CURETTAGE OF UTERUS    . ESOPHAGEAL DILATION    . ESOPHAGOGASTRODUODENOSCOPY  02/12/2012   Procedure: ESOPHAGOGASTRODUODENOSCOPY (EGD);  Surgeon: Inda Castle, MD;  Location:  [facility];  Service: Endoscopy;  Laterality: N/A;  . HOT HEMOSTASIS  02/12/2012   Procedure: HOT HEMOSTASIS (ARGON PLASMA COAGULATION/BICAP);  Surgeon: Inda Castle, MD;  Location: [facility];  Service: Endoscopy;  Laterality: N/A;  . LUMBAR LAMINECTOMY/DECOMPRESSION MICRODISCECTOMY Left 03/01/2020   Procedure: Left Lumbar One-Two Microdiscectomy;  Surgeon: Erline Levine, MD;  Location: [facility];  Service: Neurosurgery;  Laterality: Left;  Left Lumbar One-Two Microdiscectomy  . MASTECTOMY Right 1986   with implant  . THYROID CYST EXCISION  2000  . TONSILLECTOMY  1968    Social History   Socioeconomic History  . Marital status: Married    Spouse name: Kyleena Scheirer.  . Number of children: 2  . Years of education: 53  . Highest education level: Not on file  Occupational History  . Occupation: Sales    Comment: [employer]  Tobacco Use  . Smoking status: Never Smoker  . Smokeless tobacco: Never Used  Vaping Use  . Vaping Use: Never used  Substance and Sexual Activity  . Alcohol use: No  . Drug use: No  . Sexual activity: Not on file  Other Topics Concern  . Not on file  Social History Narrative   Lives at home with husband.   Caffeine use: Drinks decaf tea   Right handed   Social Determinants of Health  Financial Resource Strain: Low Risk   . Difficulty of Paying Living Expenses: Not very hard  Food Insecurity: No Food Insecurity  . Worried About Charity fundraiser in the Last Year: Never true  . Ran Out of Food in the Last Year: Never true  Transportation Needs: No Transportation Needs  . Lack of Transportation (Medical): No  . Lack of Transportation (Non-Medical): No  Physical Activity: Inactive  . Days of Exercise per Week: 0 days  . Minutes of Exercise per Session: 0 min  Stress: No Stress Concern Present  . Feeling of Stress : Only a little  Social Connections: Socially Integrated  . Frequency of Communication with Friends and Family: More than three  times a week  . Frequency of Social Gatherings with Friends and Family: Once a week  . Attends Religious Services: 1 to 4 times per year  . Active Member of Clubs or Organizations: Yes  . Attends Archivist Meetings: 1 to 4 times per year  . Marital Status: Married  Human resources officer Violence: Not At Risk  . Fear of Current or Ex-Partner: No  . Emotionally Abused: No  . Physically Abused: No  . Sexually Abused: No    Review of Systems  Cardiovascular: Positive for dyspnea on exertion. Negative for chest pain and leg swelling.  Musculoskeletal: Positive for arthritis and back pain.  Gastrointestinal: Negative for melena.   Objective:   Vitals with BMI 03/28/2020 03/02/2020 03/02/2020  Height 5\' 2"  - -  Weight 194 lbs - -  BMI 82.99 - -  Systolic 371 696 789  Diastolic 67 67 74  Pulse 82 94 83    Orthostatic VS for the past 72 hrs (Last 3 readings):  Patient Position BP Location Cuff Size  03/28/20 1515 Sitting Left Arm Large    Physical Exam Vitals reviewed.  Constitutional:      Appearance: She is well-developed.     Comments: Moderately obese  Cardiovascular:     Rate and Rhythm: Normal rate and regular rhythm.     Pulses: Intact distal pulses.          Carotid pulses are on the right side with bruit and on the left side with bruit.    Heart sounds: Murmur heard.  Early systolic murmur is present with a grade of 2/6 at the upper right sternal border and apex.   Pulmonary:     Effort: Pulmonary effort is normal. No accessory muscle usage or respiratory distress.     Breath sounds: Normal breath sounds.  Abdominal:     General: Bowel sounds are normal.     Palpations: Abdomen is soft.  Musculoskeletal:        General: Normal range of motion.  Neurological:     Mental Status: She is oriented to person, place, and time.    Radiology: No results found.  Laboratory examination:    PRN Meds:. There are no discontinued medications. Current Meds    Medication Sig  . amoxicillin (AMOXIL) 500 MG capsule Take 2,000 mg by mouth See admin instructions. Take 4 capsules prior to having teeth cleaned or dental procedure   . aspirin EC 81 MG tablet Take 81 mg by mouth daily.  . Biotin 1000 MCG tablet Take 1,000 mcg by mouth daily.  . carisoprodol (SOMA) 250 MG tablet Take 250 mg by mouth every 8 (eight) hours as needed (for muscle spasms).   Marland Kitchen diltiazem (CARDIZEM SR) 90 MG 12 hr capsule TAKE 1 CAPSULE(90 MG)  BY MOUTH TWICE DAILY (Patient taking differently: Take 90 mg by mouth 2 (two) times daily. )  . ezetimibe (ZETIA) 10 MG tablet Take 10 mg by mouth daily.  . fluvastatin (LESCOL) 20 MG capsule Take 20 mg by mouth daily.  . folic acid (FOLVITE) 557 MCG tablet Take 800 mcg by mouth daily.  Marland Kitchen HYDROcodone-acetaminophen (NORCO) 10-325 MG tablet Take 1 tablet by mouth 2 (two) times daily as needed for moderate pain.   Marland Kitchen leflunomide (ARAVA) 20 MG tablet Take 20 mg by mouth daily.  Marland Kitchen losartan (COZAAR) 25 MG tablet Take 25 mg by mouth daily.  Marland Kitchen omeprazole-sodium bicarbonate (ZEGERID) 40-1100 MG per capsule Take 1 capsule by mouth daily.   Marland Kitchen sulfaSALAzine (AZULFIDINE) 500 MG tablet Take 500 mg by mouth 2 (two) times daily.  Marland Kitchen zolpidem (AMBIEN) 10 MG tablet Take 5 mg by mouth at bedtime.     Cardiac Studies:   Cardiac cath 2002: Hyperdynamic LV and normal coronary arteries: Done for chest pain and exercise treadmill stress exercise-induced nonsustained ventricular tachycardia. Catherization 1994 and 1995 Normal  30 day event monitor 07/27/2018-08/25/2018: 2 patient triggered events with reported symptoms related with normal sinus rhythm with first-degree block. Lowest bradycardiac episode occurred on day 19 (11/24) at 10:31 AM with HR at 44 bpm that patient stated she did have symptoms of near syncope during that time. No high degree AV block, A. fib, or SVT noted.  Echocardiogram 06/13/2018: Left ventricle cavity is normal in size. Normal global  wall motion. Doppler evidence of grade I (impaired) diastolic dysfunction, normal LAP. Calculated EF 66%.  Left atrial cavity is mildly dilated at 4 cm and 52 mL. Structurally normal trileaflet aortic valve with no regurgitation noted. There is mild pressure gradient across the AV with mean of 10 mm Hg and peak 18 mm Hg, AVA 1.99 cm. Compared to the study done on 03/23/2017, no significant change.  Carotid artery duplex 06/13/2018: No hemodynamically significant arterial disease in the internal carotid artery bilaterally. No significant plaque noted as well. Antegrade right vertebral artery flow. Antegrade left vertebral artery flow. Compared to the study done on 02/06/2015, no significant change. Incidental right thyroid nodule noted. Consider dedicated scan.  Assessment:     ICD-10-CM   1. Essential hypertension  I10 EKG 12-Lead  2. Dyspnea on exertion  R06.00   3. Chronic diastolic heart failure (HCC)  I50.32      EKG 08/25/2019: Normal sinus rhythm at rate of 65 bpm, normal axis.  Incomplete right bundle branch block.  Normal EKG. No significant change from  EKG 05/16/2018: Sinus bradycardia at 56 bpm, normal axis, normal interval, no evidence of ischemia. Normal EKG.  Recommendations:   Cynthia Farrell  is a 74 y.o. Caucasian female  with mild hypertension, hyperdynamic LV, chronic palpitations (resolved with diltiazem used also to reduce HR due to intraventricular PG), moderate obesity, history of rheumatoid arthritis, osteo arthritis, fibromyalgia presents here for follow-up of recurrent episodes of near syncope that has been occurring over the past 2-3 years felt to be vasovagal.   This is a 37-month office visit, she underwent back surgery in June 2021 that is 3 weeks ago and has recuperated well.  Back pain is improved significantly.  Today her blood pressures well controlled, dyspnea has remained stable, unfortunately she has not had any recurrence of syncope or near syncope since last  office visit.  No changes in the medications were done today.  I will see her back in a year.  Adrian Prows, MD, [facility] 03/28/2020, 4:05 PM Office: [phone]

## 2020-04-01 DIAGNOSIS — E7800 Pure hypercholesterolemia, unspecified: Secondary | ICD-10-CM | POA: Diagnosis not present

## 2020-04-01 DIAGNOSIS — I10 Essential (primary) hypertension: Secondary | ICD-10-CM | POA: Diagnosis not present

## 2020-04-01 DIAGNOSIS — F419 Anxiety disorder, unspecified: Secondary | ICD-10-CM | POA: Diagnosis not present

## 2020-04-01 DIAGNOSIS — F5101 Primary insomnia: Secondary | ICD-10-CM | POA: Diagnosis not present

## 2020-04-02 ENCOUNTER — Other Ambulatory Visit: Payer: Self-pay | Admitting: *Deleted

## 2020-04-02 NOTE — Patient Outreach (Signed)
[facility]) Care Management  04/02/2020  Cynthia Farrell [birth year] [id number]   Transition of care call: No answer. Left a message to return my call.  04/05/20 Second telephone outreach:  Talked to [name] today. She continues to report improvement in her back pain. She is not using oxycodone at all. She only has to take hydrocodone every once in awhile. She does have OA in other parts or her body also (hands and knees). She says there is not much that works.  Suggested topical preparations she can try diclofenac gel, bio-freeze etc. She can ask Dr. Vertell Limber what preparations he prefers. She will see him soon.   We agreed we would talk again in one month and we will wind up our involvement.   [name]. [name], MSN, [facility] Gerontological Nurse Practitioner [facility] Care Management [phone]

## 2020-04-08 DIAGNOSIS — M0609 Rheumatoid arthritis without rheumatoid factor, multiple sites: Secondary | ICD-10-CM | POA: Diagnosis not present

## 2020-04-08 DIAGNOSIS — Z79899 Other long term (current) drug therapy: Secondary | ICD-10-CM | POA: Diagnosis not present

## 2020-04-08 DIAGNOSIS — M79643 Pain in unspecified hand: Secondary | ICD-10-CM | POA: Diagnosis not present

## 2020-04-08 DIAGNOSIS — M1990 Unspecified osteoarthritis, unspecified site: Secondary | ICD-10-CM | POA: Diagnosis not present

## 2020-04-18 ENCOUNTER — Other Ambulatory Visit: Payer: Self-pay | Admitting: Cardiology

## 2020-05-09 DIAGNOSIS — M25561 Pain in right knee: Secondary | ICD-10-CM | POA: Diagnosis not present

## 2020-05-15 DIAGNOSIS — M25561 Pain in right knee: Secondary | ICD-10-CM | POA: Diagnosis not present

## 2020-05-17 ENCOUNTER — Other Ambulatory Visit: Payer: Self-pay | Admitting: *Deleted

## 2020-05-17 NOTE — Patient Outreach (Signed)
[facility]) Care Management  05/17/2020  Cynthia Farrell [birth year] [id number]  Telephone outreach.  [name] had a discetomy at L1/2 on the L side on 03/01/20.   She reports today that her back is feeling good only mild discomfort from time to time. However, she has now hurt her R knee. She will be getting an MRI next week. She is icing her knee and a steroid taper was ordered but she is having difficulty getting the RX. May be an electronic problem between provider and drug store. Encouraged to call provider again if pharmacy does not have the order.  Other health issues hx: HTN, Hyperlipidemia, OA, RA, GERD are well controlled.  [name] have not been vaccinated against COVID. Encouraged to get the vaccine to not only protect themselves but their loved ones.  NP intended on closing her case today but will call again in one month to follow up on her knee injury and the plan of care.  [name]. [name], MSN, [facility] Gerontological Nurse Practitioner [facility] Care Management [phone]

## 2020-05-21 DIAGNOSIS — M25561 Pain in right knee: Secondary | ICD-10-CM | POA: Diagnosis not present

## 2020-05-23 DIAGNOSIS — M25561 Pain in right knee: Secondary | ICD-10-CM | POA: Diagnosis not present

## 2020-06-10 DIAGNOSIS — M79643 Pain in unspecified hand: Secondary | ICD-10-CM | POA: Diagnosis not present

## 2020-06-10 DIAGNOSIS — M1990 Unspecified osteoarthritis, unspecified site: Secondary | ICD-10-CM | POA: Diagnosis not present

## 2020-06-10 DIAGNOSIS — Z79899 Other long term (current) drug therapy: Secondary | ICD-10-CM | POA: Diagnosis not present

## 2020-06-10 DIAGNOSIS — M0609 Rheumatoid arthritis without rheumatoid factor, multiple sites: Secondary | ICD-10-CM | POA: Diagnosis not present

## 2020-06-17 DIAGNOSIS — M1711 Unilateral primary osteoarthritis, right knee: Secondary | ICD-10-CM | POA: Diagnosis not present

## 2020-06-18 ENCOUNTER — Other Ambulatory Visit: Payer: Self-pay | Admitting: *Deleted

## 2020-06-18 NOTE — Patient Outreach (Signed)
[facility]) Care Management  06/18/2020  Cynthia Farrell Dec 29, 1945 [id number]  Telephone outreach, left message and requested a return call.  [name]. [name], MSN, [facility] Gerontological Nurse Practitioner [facility] Care Management [phone]

## 2020-06-24 DIAGNOSIS — M1711 Unilateral primary osteoarthritis, right knee: Secondary | ICD-10-CM | POA: Diagnosis not present

## 2020-06-26 DIAGNOSIS — Z20822 Contact with and (suspected) exposure to covid-19: Secondary | ICD-10-CM | POA: Diagnosis not present

## 2020-07-01 DIAGNOSIS — M1711 Unilateral primary osteoarthritis, right knee: Secondary | ICD-10-CM | POA: Diagnosis not present

## 2020-07-02 ENCOUNTER — Other Ambulatory Visit: Payer: Self-pay

## 2020-07-02 ENCOUNTER — Other Ambulatory Visit: Payer: Self-pay | Admitting: *Deleted

## 2020-07-02 NOTE — Patient Outreach (Addendum)
[facility]) Care Management  07/02/2020  Cynthia Farrell [birth year] [id number]  Telephone outreach for update and possible case closure.  Note pt had COVID 19 testing so they could go to see [facility]. Her test was negative. She has not been vaccinated.     Back: Still has pain but not same pain, constant, dull, cramping. Cannot stand longer than 1 hour. She is planning on calling her surgeon today to see if she can be seen tomorrow instead of having a virtual visit.   Knee Has had several steroid injections in her R. She says she needs injections in both.  NP to continue following due to her continued back pain. Will call again in one week.  [name]. [name], MSN, [facility] Gerontological Nurse Practitioner [facility] Care Management [phone]

## 2020-07-03 DIAGNOSIS — M545 Low back pain, unspecified: Secondary | ICD-10-CM | POA: Diagnosis not present

## 2020-07-03 DIAGNOSIS — G8929 Other chronic pain: Secondary | ICD-10-CM | POA: Diagnosis not present

## 2020-07-03 DIAGNOSIS — M4316 Spondylolisthesis, lumbar region: Secondary | ICD-10-CM | POA: Diagnosis not present

## 2020-07-03 DIAGNOSIS — M5136 Other intervertebral disc degeneration, lumbar region: Secondary | ICD-10-CM | POA: Diagnosis not present

## 2020-07-09 ENCOUNTER — Encounter: Payer: Self-pay | Admitting: *Deleted

## 2020-07-09 ENCOUNTER — Other Ambulatory Visit: Payer: Self-pay | Admitting: *Deleted

## 2020-07-09 NOTE — Patient Outreach (Signed)
[facility]) Care Management  07/09/2020  CASSIDEY BARRALES 1946/01/14 [id number]  Telephone outreach for f/u back pain.  Mrs. Carel is feeling very well today. She rates her pain 2/10 today. She is quite cheerful and chatty. She talked with me about activities she enjoys.  She reports that her surgeon has advised her she needs to exercise and that water aerobics would be ideal for her. She has not called her Y yet but that is on her "to do" list this week.  Encouraged her to do something she enjoys every day and especially when she has a good day with less pain to get out and do something pleasurable, like get a pedicure.  We will talk again in one month.  [name]. [name], MSN, [facility] Gerontological Nurse Practitioner [facility] Care Management [phone]

## 2020-07-30 DIAGNOSIS — M5459 Other low back pain: Secondary | ICD-10-CM | POA: Diagnosis not present

## 2020-07-30 DIAGNOSIS — M6281 Muscle weakness (generalized): Secondary | ICD-10-CM | POA: Diagnosis not present

## 2020-08-05 ENCOUNTER — Other Ambulatory Visit: Payer: Self-pay | Admitting: *Deleted

## 2020-08-06 ENCOUNTER — Other Ambulatory Visit: Payer: Self-pay | Admitting: *Deleted

## 2020-08-06 DIAGNOSIS — M5459 Other low back pain: Secondary | ICD-10-CM | POA: Diagnosis not present

## 2020-08-06 DIAGNOSIS — M6281 Muscle weakness (generalized): Secondary | ICD-10-CM | POA: Diagnosis not present

## 2020-08-06 NOTE — Patient Outreach (Addendum)
[facility]) Care Management  08/06/2020  JUEL RIPLEY 10-16-1945 [id number]  Incoming call from Mrs. Skoog.  She reports she is doing better. She has had some therapy and is going for her first water aerobics class today. She reports her back pain is 3/10 today.   She has not yet received COVID or Flu vaccinations and both of these were strongly encouraged.  NP in car. We agreed to talk after the holidays in January.  [name]. [name], MSN, [facility] Gerontological Nurse Practitioner [facility] Care Management [phone]

## 2020-08-13 DIAGNOSIS — M6281 Muscle weakness (generalized): Secondary | ICD-10-CM | POA: Diagnosis not present

## 2020-08-13 DIAGNOSIS — M5459 Other low back pain: Secondary | ICD-10-CM | POA: Diagnosis not present

## 2020-08-16 DIAGNOSIS — M6281 Muscle weakness (generalized): Secondary | ICD-10-CM | POA: Diagnosis not present

## 2020-08-16 DIAGNOSIS — M5459 Other low back pain: Secondary | ICD-10-CM | POA: Diagnosis not present

## 2020-08-20 DIAGNOSIS — M6281 Muscle weakness (generalized): Secondary | ICD-10-CM | POA: Diagnosis not present

## 2020-08-20 DIAGNOSIS — M5459 Other low back pain: Secondary | ICD-10-CM | POA: Diagnosis not present

## 2020-08-27 DIAGNOSIS — M5459 Other low back pain: Secondary | ICD-10-CM | POA: Diagnosis not present

## 2020-08-27 DIAGNOSIS — M6281 Muscle weakness (generalized): Secondary | ICD-10-CM | POA: Diagnosis not present

## 2020-08-29 DIAGNOSIS — M5459 Other low back pain: Secondary | ICD-10-CM | POA: Diagnosis not present

## 2020-08-29 DIAGNOSIS — M6281 Muscle weakness (generalized): Secondary | ICD-10-CM | POA: Diagnosis not present

## 2020-09-02 DIAGNOSIS — M5459 Other low back pain: Secondary | ICD-10-CM | POA: Diagnosis not present

## 2020-09-02 DIAGNOSIS — M6281 Muscle weakness (generalized): Secondary | ICD-10-CM | POA: Diagnosis not present

## 2020-09-09 DIAGNOSIS — M1990 Unspecified osteoarthritis, unspecified site: Secondary | ICD-10-CM | POA: Diagnosis not present

## 2020-09-09 DIAGNOSIS — Z79899 Other long term (current) drug therapy: Secondary | ICD-10-CM | POA: Diagnosis not present

## 2020-09-09 DIAGNOSIS — M79643 Pain in unspecified hand: Secondary | ICD-10-CM | POA: Diagnosis not present

## 2020-09-09 DIAGNOSIS — M0609 Rheumatoid arthritis without rheumatoid factor, multiple sites: Secondary | ICD-10-CM | POA: Diagnosis not present

## 2020-09-10 DIAGNOSIS — M5459 Other low back pain: Secondary | ICD-10-CM | POA: Diagnosis not present

## 2020-09-10 DIAGNOSIS — M6281 Muscle weakness (generalized): Secondary | ICD-10-CM | POA: Diagnosis not present

## 2020-09-17 DIAGNOSIS — M6281 Muscle weakness (generalized): Secondary | ICD-10-CM | POA: Diagnosis not present

## 2020-09-17 DIAGNOSIS — M5459 Other low back pain: Secondary | ICD-10-CM | POA: Diagnosis not present

## 2020-09-19 DIAGNOSIS — M5459 Other low back pain: Secondary | ICD-10-CM | POA: Diagnosis not present

## 2020-09-19 DIAGNOSIS — M6281 Muscle weakness (generalized): Secondary | ICD-10-CM | POA: Diagnosis not present

## 2020-10-01 ENCOUNTER — Other Ambulatory Visit: Payer: Self-pay | Admitting: *Deleted

## 2020-10-01 NOTE — Patient Outreach (Signed)
[facility]) Care Management  10/01/2020  Cynthia Farrell [birth year] [id number]  Telephone outreach for follow up pain management.  Cynthia Farrell says she is doing well. Her pain level has decreased considerably. She did take a hydrocodone/apap 10/347m this am when her back pain was 4/10. Her pain level now is 1/10.  She reminds me that she also has fibromyalgia and RA.   She has not yet received a COVID 19 vaccine yet. We talked at length about this and encouragement was given in support of getting the vaccination.  Mrs. BEhrlichhas met her goals and her case will be closed.  Goals Addressed            This Visit's Progress   . COMPLETED: Client understands the importance of follow-up with providers by attending scheduled visits      . COMPLETED: Cope with Chronic Pain       Follow Up Date: 10/21/20 Pt to report any new methods for pain control over the next month.   Notes: Reviewed safety, prevent falls, use walker. Avoid stressing her back with household chores. Plan on re-evaluating depression screen this month. 07/09/20 Pain much better today 2/10. Sending back strengthening exercises for her to do at home. Will start water aerobics. 08/06/20 Pt starting water aerobics today. Pain level 3/10. 10/01/20 Pain level has decreased significantly. Today at this time it is 1/10. She is able to do all her activities that are meaningful.      . COMPLETED: Manage Pain       Follow Up Date 10/21/20  Pt to report pain reduction from 10/10 to 8/10.   Notes: Unresolved back pain post discectomy 5 months ago. Current regimen not adequate and feels something else may be going on. Pt to call for OV instead of virtual visit with surgeon tomorrow. 08/06/20 Cynthia Farrell pain has improved with therapy and she will start water aerobics today. Her pain level at present is 3/10. Encouraged continued efforts in exercise 10/01/20 pain level is 1/10 today!      CEulah Pont SMyrtie Neither MSN,  GNP-BC Gerontological Nurse Practitioner [facility] [phone]  .

## 2020-10-16 ENCOUNTER — Other Ambulatory Visit: Payer: Self-pay | Admitting: Cardiology

## 2020-10-21 DIAGNOSIS — M1990 Unspecified osteoarthritis, unspecified site: Secondary | ICD-10-CM | POA: Diagnosis not present

## 2020-10-21 DIAGNOSIS — Z79899 Other long term (current) drug therapy: Secondary | ICD-10-CM | POA: Diagnosis not present

## 2020-10-21 DIAGNOSIS — M79643 Pain in unspecified hand: Secondary | ICD-10-CM | POA: Diagnosis not present

## 2020-10-21 DIAGNOSIS — M0609 Rheumatoid arthritis without rheumatoid factor, multiple sites: Secondary | ICD-10-CM | POA: Diagnosis not present

## 2020-10-28 DIAGNOSIS — M4126 Other idiopathic scoliosis, lumbar region: Secondary | ICD-10-CM | POA: Diagnosis not present

## 2020-10-28 DIAGNOSIS — M4316 Spondylolisthesis, lumbar region: Secondary | ICD-10-CM | POA: Diagnosis not present

## 2020-10-28 DIAGNOSIS — M545 Low back pain, unspecified: Secondary | ICD-10-CM | POA: Diagnosis not present

## 2020-10-28 DIAGNOSIS — M5416 Radiculopathy, lumbar region: Secondary | ICD-10-CM | POA: Diagnosis not present

## 2020-10-30 ENCOUNTER — Other Ambulatory Visit: Payer: Self-pay | Admitting: Neurosurgery

## 2020-10-30 DIAGNOSIS — M5416 Radiculopathy, lumbar region: Secondary | ICD-10-CM

## 2020-11-11 DIAGNOSIS — E7800 Pure hypercholesterolemia, unspecified: Secondary | ICD-10-CM | POA: Diagnosis not present

## 2020-11-11 DIAGNOSIS — I10 Essential (primary) hypertension: Secondary | ICD-10-CM | POA: Diagnosis not present

## 2020-11-11 DIAGNOSIS — M0609 Rheumatoid arthritis without rheumatoid factor, multiple sites: Secondary | ICD-10-CM | POA: Diagnosis not present

## 2020-11-17 ENCOUNTER — Other Ambulatory Visit: Payer: Medicare Other

## 2020-11-19 DIAGNOSIS — M0609 Rheumatoid arthritis without rheumatoid factor, multiple sites: Secondary | ICD-10-CM | POA: Diagnosis not present

## 2020-11-19 DIAGNOSIS — E7800 Pure hypercholesterolemia, unspecified: Secondary | ICD-10-CM | POA: Diagnosis not present

## 2020-11-19 DIAGNOSIS — I10 Essential (primary) hypertension: Secondary | ICD-10-CM | POA: Diagnosis not present

## 2020-11-19 DIAGNOSIS — M797 Fibromyalgia: Secondary | ICD-10-CM | POA: Diagnosis not present

## 2020-11-19 DIAGNOSIS — Z79899 Other long term (current) drug therapy: Secondary | ICD-10-CM | POA: Diagnosis not present

## 2020-11-26 DIAGNOSIS — Z79899 Other long term (current) drug therapy: Secondary | ICD-10-CM | POA: Diagnosis not present

## 2020-11-26 DIAGNOSIS — Z9181 History of falling: Secondary | ICD-10-CM | POA: Diagnosis not present

## 2020-11-26 DIAGNOSIS — M25569 Pain in unspecified knee: Secondary | ICD-10-CM | POA: Diagnosis not present

## 2020-11-26 DIAGNOSIS — M545 Low back pain, unspecified: Secondary | ICD-10-CM | POA: Diagnosis not present

## 2020-12-10 DIAGNOSIS — Z6825 Body mass index (BMI) 25.0-25.9, adult: Secondary | ICD-10-CM | POA: Diagnosis not present

## 2020-12-10 DIAGNOSIS — M545 Low back pain, unspecified: Secondary | ICD-10-CM | POA: Diagnosis not present

## 2020-12-10 DIAGNOSIS — Z79899 Other long term (current) drug therapy: Secondary | ICD-10-CM | POA: Diagnosis not present

## 2020-12-10 DIAGNOSIS — M170 Bilateral primary osteoarthritis of knee: Secondary | ICD-10-CM | POA: Diagnosis not present

## 2020-12-12 ENCOUNTER — Other Ambulatory Visit: Payer: Self-pay

## 2020-12-12 ENCOUNTER — Ambulatory Visit
Admission: RE | Admit: 2020-12-12 | Discharge: 2020-12-12 | Disposition: A | Discharge status: Home / Self Care | Payer: Medicare Other | Source: Ambulatory Visit | Attending: Neurosurgery | Admitting: Neurosurgery

## 2020-12-12 DIAGNOSIS — M5115 Intervertebral disc disorders with radiculopathy, thoracolumbar region: Secondary | ICD-10-CM | POA: Diagnosis not present

## 2020-12-12 DIAGNOSIS — M5117 Intervertebral disc disorders with radiculopathy, lumbosacral region: Secondary | ICD-10-CM | POA: Diagnosis not present

## 2020-12-12 DIAGNOSIS — M5116 Intervertebral disc disorders with radiculopathy, lumbar region: Secondary | ICD-10-CM | POA: Diagnosis not present

## 2020-12-12 DIAGNOSIS — M48061 Spinal stenosis, lumbar region without neurogenic claudication: Secondary | ICD-10-CM | POA: Diagnosis not present

## 2020-12-12 DIAGNOSIS — M5416 Radiculopathy, lumbar region: Secondary | ICD-10-CM

## 2020-12-12 MED ORDER — GADOBENATE DIMEGLUMINE 529 MG/ML IV SOLN
17.0000 mL | Freq: Once | INTRAVENOUS | Status: AC | PRN
  Administered 2020-12-12: 17 mL via INTRAVENOUS

## 2020-12-17 DIAGNOSIS — M8589 Other specified disorders of bone density and structure, multiple sites: Secondary | ICD-10-CM | POA: Diagnosis not present

## 2020-12-17 DIAGNOSIS — M79643 Pain in unspecified hand: Secondary | ICD-10-CM | POA: Diagnosis not present

## 2020-12-17 DIAGNOSIS — M0609 Rheumatoid arthritis without rheumatoid factor, multiple sites: Secondary | ICD-10-CM | POA: Diagnosis not present

## 2020-12-17 DIAGNOSIS — M1990 Unspecified osteoarthritis, unspecified site: Secondary | ICD-10-CM | POA: Diagnosis not present

## 2020-12-17 DIAGNOSIS — Z79899 Other long term (current) drug therapy: Secondary | ICD-10-CM | POA: Diagnosis not present

## 2020-12-25 DIAGNOSIS — M47816 Spondylosis without myelopathy or radiculopathy, lumbar region: Secondary | ICD-10-CM | POA: Diagnosis not present

## 2020-12-25 DIAGNOSIS — M4126 Other idiopathic scoliosis, lumbar region: Secondary | ICD-10-CM | POA: Diagnosis not present

## 2020-12-25 DIAGNOSIS — M545 Low back pain, unspecified: Secondary | ICD-10-CM | POA: Diagnosis not present

## 2020-12-25 DIAGNOSIS — M4316 Spondylolisthesis, lumbar region: Secondary | ICD-10-CM | POA: Diagnosis not present

## 2021-01-10 DIAGNOSIS — M545 Low back pain, unspecified: Secondary | ICD-10-CM | POA: Diagnosis not present

## 2021-01-10 DIAGNOSIS — Z6825 Body mass index (BMI) 25.0-25.9, adult: Secondary | ICD-10-CM | POA: Diagnosis not present

## 2021-01-10 DIAGNOSIS — M170 Bilateral primary osteoarthritis of knee: Secondary | ICD-10-CM | POA: Diagnosis not present

## 2021-01-10 DIAGNOSIS — Z79899 Other long term (current) drug therapy: Secondary | ICD-10-CM | POA: Diagnosis not present

## 2021-01-15 DIAGNOSIS — H43813 Vitreous degeneration, bilateral: Secondary | ICD-10-CM | POA: Diagnosis not present

## 2021-01-15 DIAGNOSIS — Z961 Presence of intraocular lens: Secondary | ICD-10-CM | POA: Diagnosis not present

## 2021-01-15 DIAGNOSIS — M069 Rheumatoid arthritis, unspecified: Secondary | ICD-10-CM | POA: Diagnosis not present

## 2021-02-03 ENCOUNTER — Telehealth: Payer: Self-pay

## 2021-02-03 NOTE — Telephone Encounter (Signed)
I am fine for her to be on it and she can call if problems. I am not too concerned about her heart for now

## 2021-02-03 NOTE — Telephone Encounter (Signed)
Patient called that she is currently on Leflunomide for her rheumatoid arthritis but it is not helping therefore they recommended Rituximab patient read people with irregular heart beat should not be on it patient wanted to let you know and what were your thoughts please advise

## 2021-02-04 NOTE — Telephone Encounter (Signed)
No answer left a vm to call back

## 2021-02-06 NOTE — Telephone Encounter (Signed)
Called patient back, she will start Rituximab.

## 2021-02-10 DIAGNOSIS — Z6825 Body mass index (BMI) 25.0-25.9, adult: Secondary | ICD-10-CM | POA: Diagnosis not present

## 2021-02-10 DIAGNOSIS — M170 Bilateral primary osteoarthritis of knee: Secondary | ICD-10-CM | POA: Diagnosis not present

## 2021-02-10 DIAGNOSIS — M545 Low back pain, unspecified: Secondary | ICD-10-CM | POA: Diagnosis not present

## 2021-02-10 DIAGNOSIS — Z79899 Other long term (current) drug therapy: Secondary | ICD-10-CM | POA: Diagnosis not present

## 2021-02-18 DIAGNOSIS — M0609 Rheumatoid arthritis without rheumatoid factor, multiple sites: Secondary | ICD-10-CM | POA: Diagnosis not present

## 2021-02-18 DIAGNOSIS — M79643 Pain in unspecified hand: Secondary | ICD-10-CM | POA: Diagnosis not present

## 2021-02-18 DIAGNOSIS — M1990 Unspecified osteoarthritis, unspecified site: Secondary | ICD-10-CM | POA: Diagnosis not present

## 2021-02-18 DIAGNOSIS — Z79899 Other long term (current) drug therapy: Secondary | ICD-10-CM | POA: Diagnosis not present

## 2021-02-28 ENCOUNTER — Other Ambulatory Visit: Payer: Self-pay | Admitting: Family Medicine

## 2021-02-28 DIAGNOSIS — Z1231 Encounter for screening mammogram for malignant neoplasm of breast: Secondary | ICD-10-CM

## 2021-03-04 ENCOUNTER — Ambulatory Visit
Admission: RE | Admit: 2021-03-04 | Discharge: 2021-03-04 | Disposition: A | Discharge status: Home / Self Care | Payer: Medicare Other | Source: Ambulatory Visit | Attending: Family Medicine | Admitting: Family Medicine

## 2021-03-04 ENCOUNTER — Other Ambulatory Visit: Payer: Self-pay

## 2021-03-04 DIAGNOSIS — Z1231 Encounter for screening mammogram for malignant neoplasm of breast: Secondary | ICD-10-CM

## 2021-03-13 DIAGNOSIS — M170 Bilateral primary osteoarthritis of knee: Secondary | ICD-10-CM | POA: Diagnosis not present

## 2021-03-13 DIAGNOSIS — Z79899 Other long term (current) drug therapy: Secondary | ICD-10-CM | POA: Diagnosis not present

## 2021-03-13 DIAGNOSIS — Z6825 Body mass index (BMI) 25.0-25.9, adult: Secondary | ICD-10-CM | POA: Diagnosis not present

## 2021-03-13 DIAGNOSIS — M545 Low back pain, unspecified: Secondary | ICD-10-CM | POA: Diagnosis not present

## 2021-03-26 DIAGNOSIS — I10 Essential (primary) hypertension: Secondary | ICD-10-CM | POA: Diagnosis not present

## 2021-03-26 DIAGNOSIS — M47816 Spondylosis without myelopathy or radiculopathy, lumbar region: Secondary | ICD-10-CM | POA: Diagnosis not present

## 2021-03-26 DIAGNOSIS — M5416 Radiculopathy, lumbar region: Secondary | ICD-10-CM | POA: Diagnosis not present

## 2021-03-26 DIAGNOSIS — M4126 Other idiopathic scoliosis, lumbar region: Secondary | ICD-10-CM | POA: Diagnosis not present

## 2021-03-31 ENCOUNTER — Encounter: Payer: Self-pay | Admitting: Cardiology

## 2021-03-31 ENCOUNTER — Ambulatory Visit: Payer: Medicare Other | Admitting: Cardiology

## 2021-03-31 ENCOUNTER — Other Ambulatory Visit: Payer: Self-pay

## 2021-03-31 VITALS — BP 131/81 | HR 68 | Temp 98.2°F | Resp 16 | Ht 62.0 in | Wt 185.6 lb

## 2021-03-31 DIAGNOSIS — R0609 Other forms of dyspnea: Secondary | ICD-10-CM | POA: Diagnosis not present

## 2021-03-31 DIAGNOSIS — I5032 Chronic diastolic (congestive) heart failure: Secondary | ICD-10-CM

## 2021-03-31 DIAGNOSIS — R0600 Dyspnea, unspecified: Secondary | ICD-10-CM

## 2021-03-31 DIAGNOSIS — I10 Essential (primary) hypertension: Secondary | ICD-10-CM | POA: Diagnosis not present

## 2021-03-31 NOTE — Progress Notes (Signed)
Primary Physician:  Jani Gravel, MD   Patient ID: Georgeanna Lea, female    DOB: 1945-12-05, 75 y.o.   MRN: [id number]  Subjective:    Chief Complaint  Patient presents with   Hypertension   Shortness of Breath   Follow-up    1 year    HPI: Cynthia Farrell  is a 75 y.o. female  Caucasian female  with mild hypertension, hyperdynamic LV with intra ventricular pressure gradient by echocardiogram previously, palpitations which have resolved since being on diltiazem, improvement in dyspnea since reducing her heart rate with diltiazem, obesity, history of rheumatoid arthritis, osteo arthritis, fibromyalgia presents here for follow-up of hypertension, chronic dyspnea, recurrent episodes of near syncope that has been occurring over the past 2-3 years felt to be vasovagal.  Since last office visit she has not had any further episodes of dizziness or near syncope and is completely asymptomatic except for mild chronic dyspnea and backpain.  She is also lost about 10 to 12 pounds in weight.   Past Medical History:  Diagnosis Date   Atrial fibrillation ([place])    Breast cancer ([place])    breast    Diverticular disease    Dysrhythmia    a remote history in 1990's - per Dr. Irven Shelling notes.   Esophageal stricture    Fibromyalgia    GERD (gastroesophageal reflux disease)    Heart murmur    Hemorrhoids    Hyperlipemia    Hypertension    MVP (mitral valve prolapse)    Osteoarthritis    RA   Pneumonia    Rheumatoid aortitis     Past Surgical History:  Procedure Laterality Date   ABDOMINAL HYSTERECTOMY  1995   APPENDECTOMY  1956   BREAST BIOPSY  1972   BREAST LUMPECTOMY Left    CARDIAC CATHETERIZATION  1997 & 2005   CATARACT EXTRACTION Bilateral    CHOLECYSTECTOMY  1999   COLONOSCOPY  02/12/2012   Procedure: COLONOSCOPY;  Surgeon: Inda Castle, MD;  Location: [facility];  Service: Endoscopy;  Laterality: N/A;   DILATION AND CURETTAGE OF UTERUS     ESOPHAGEAL DILATION      ESOPHAGOGASTRODUODENOSCOPY  02/12/2012   Procedure: ESOPHAGOGASTRODUODENOSCOPY (EGD);  Surgeon: Inda Castle, MD;  Location: [facility];  Service: Endoscopy;  Laterality: N/A;   HOT HEMOSTASIS  02/12/2012   Procedure: HOT HEMOSTASIS (ARGON PLASMA COAGULATION/BICAP);  Surgeon: Inda Castle, MD;  Location: [facility];  Service: Endoscopy;  Laterality: N/A;   LUMBAR LAMINECTOMY/DECOMPRESSION MICRODISCECTOMY Left 03/01/2020   Procedure: Left Lumbar One-Two Microdiscectomy;  Surgeon: Erline Levine, MD;  Location: [facility];  Service: Neurosurgery;  Laterality: Left;  Left Lumbar One-Two Microdiscectomy   MASTECTOMY Right 1986   with implant   THYROID CYST EXCISION  2000   TONSILLECTOMY  1968   Social History   Tobacco Use   Smoking status: Never   Smokeless tobacco: Never  Substance Use Topics   Alcohol use: No  Marital Status: Married   Review of Systems  Cardiovascular:  Positive for dyspnea on exertion. Negative for chest pain and leg swelling.  Musculoskeletal:  Positive for arthritis and back pain.  Gastrointestinal:  Negative for melena.  Objective:   Vitals with BMI 03/31/2021 03/28/2020 03/02/2020  Height 5\' 2"  5\' 2"  -  Weight 185 lbs 10 oz 194 lbs -  BMI 40.97 35.32 -  Systolic 992 426 834  Diastolic 81 67 67  Pulse 68 82 94    Orthostatic VS for the past  72 hrs (Last 3 readings):  Patient Position BP Location Cuff Size  03/31/21 1403 Sitting Left Arm Large    Physical Exam Vitals reviewed.  Constitutional:      Appearance: She is obese.     Comments: Mildly obese  Neck:     Vascular: Carotid bruit (Bilateral) present. No JVD.  Cardiovascular:     Rate and Rhythm: Normal rate and regular rhythm.     Pulses: Normal pulses and intact distal pulses.          Dorsalis pedis pulses are 2+ on the right side and 2+ on the left side.       Posterior tibial pulses are 2+ on the right side and 2+ on the left side.     Heart sounds: Murmur heard.  Crescendo early systolic  murmur is present with a grade of 2/6 at the upper right sternal border and apex.  Pulmonary:     Effort: Pulmonary effort is normal. No accessory muscle usage or respiratory distress.     Breath sounds: Normal breath sounds.  Abdominal:     General: Bowel sounds are normal.     Palpations: Abdomen is soft.  Musculoskeletal:        General: No swelling. Normal range of motion.  Neurological:     Mental Status: She is oriented to person, place, and time.   Radiology: No results found.  Laboratory examination:    PRN Meds:. Medications Discontinued During This Encounter  Medication Reason   lidocaine (LIDODERM) 5 % Error   methylPREDNISolone (MEDROL) 4 MG tablet Error   sulfaSALAzine (AZULFIDINE) 500 MG tablet Error   tiZANidine (ZANAFLEX) 4 MG tablet Error   aspirin EC 81 MG tablet Discontinued by provider   Current Outpatient Medications  Medication Instructions   amoxicillin (AMOXIL) 2,000 mg, See admin instructions   Biotin 1,000 mcg, Oral, Daily   calcium carbonate (OS-CAL - DOSED IN MG OF ELEMENTAL CALCIUM) 1250 (500 Ca) MG tablet 1 tablet, Oral, Daily   carisoprodol (SOMA) 250 mg, Oral, Every 8 hours PRN   cyanocobalamin 500 mcg, Oral, Daily   diltiazem (CARDIZEM SR) 90 MG 12 hr capsule TAKE 1 CAPSULE(90 MG) BY MOUTH TWICE DAILY   ezetimibe (ZETIA) 10 mg, Oral, Daily   fluvastatin (LESCOL) 20 mg, Oral, Daily   folic acid (FOLVITE) 035 mcg, Daily   HYDROcodone-acetaminophen (NORCO) 10-325 MG tablet 1 tablet, Oral, 2 times daily PRN   leflunomide (ARAVA) 20 mg, Oral, Daily   losartan (COZAAR) 25 mg, Oral, Daily   omeprazole-sodium bicarbonate (ZEGERID) 40-1100 MG capsule 1 capsule, Oral, Daily   Vitamin D (Ergocalciferol) (DRISDOL) 50,000 Units, Oral, Weekly   zolpidem (AMBIEN) 5 mg, Oral, Daily at bedtime    Cardiac Studies:   Cardiac cath 2002: Hyperdynamic LV and normal coronary arteries: Done for chest pain and exercise treadmill stress  exercise-induced  nonsustained ventricular tachycardia.  Catherization 1994 and 1995 Normal  30 day event monitor 07/27/2018-08/25/2018: 2 patient triggered events with reported symptoms related with normal sinus rhythm with first-degree block. Lowest bradycardiac episode occurred on day 19 (11/24) at 10:31 AM with HR at 44 bpm that patient stated she did have symptoms of near syncope during that time. No high degree AV block, A. fib, or SVT noted.   Echocardiogram 06/13/2018: Left ventricle cavity is normal in size. Normal global wall motion. Doppler evidence of grade I (impaired) diastolic dysfunction, normal LAP. Calculated EF 66%.  Left atrial cavity is mildly dilated at 4 cm and 52  mL. Structurally normal trileaflet aortic valve with no regurgitation noted. There is mild pressure gradient across the AV with mean of 10 mm Hg and peak 18 mm Hg, AVA 1.99 cm. Compared to the study done on 03/23/2017, no significant change.   Carotid artery duplex 06/13/2018: No hemodynamically significant arterial disease in the internal carotid artery bilaterally. No significant plaque noted as well. Antegrade right vertebral artery flow. Antegrade left vertebral artery flow. Compared to the study done on 02/06/2015, no significant change. Incidental right thyroid nodule noted. Consider dedicated scan.  EKG:  EKG 03/31/2021: Normal sinus rhythm at rate of 64 beats minute, normal axis.  Incomplete right bundle branch block.  Normal EKG. no change from 08/25/2019, 05/16/2018.  Assessment:     ICD-10-CM   1. Essential hypertension  I10 EKG 12-Lead    2. Dyspnea on exertion  R06.00     3. Chronic diastolic heart failure (HCC)  I50.32        Recommendations:   Laylana L Lueth  is a 75 y.o. Caucasian female  with mild hypertension, hyperdynamic LV with intra ventricular pressure gradient by echocardiogram previously, palpitations which have resolved since being on diltiazem, improvement in dyspnea since reducing her heart rate with  diltiazem, obesity, history of rheumatoid arthritis, osteo arthritis, fibromyalgia presents here for follow-up of hypertension, chronic dyspnea, recurrent episodes of near syncope that has been occurring over the past 2-3 years felt to be vasovagal.  Since last office visit she has not had any further episodes of dizziness or near syncope and is completely asymptomatic except for mild chronic dyspnea and backpain.  She is also lost about 10 to 12 pounds in weight.  No clinical evidence of heart failure, blood pressure is also well controlled.  I have congratulated her on having lost weight, no change in physical exam including 3/6 midsystolic murmur at the apex.  Of the echocardiogram was 3 years ago, as she is overall stable, essentially except for chronic dyspnea no other symptoms, no further evaluation is indicated.  Unless she has new symptoms, I will see her back on a as needed basis.  Of note she also has bilateral carotid bruit however her carotid artery duplex did not reveal any significant abnormality.   Adrian Prows, MD, [facility] 03/31/2021, 10:34 PM Office: [phone]

## 2021-04-11 DIAGNOSIS — Z79899 Other long term (current) drug therapy: Secondary | ICD-10-CM | POA: Diagnosis not present

## 2021-04-11 DIAGNOSIS — Z6825 Body mass index (BMI) 25.0-25.9, adult: Secondary | ICD-10-CM | POA: Diagnosis not present

## 2021-04-11 DIAGNOSIS — M545 Low back pain, unspecified: Secondary | ICD-10-CM | POA: Diagnosis not present

## 2021-04-11 DIAGNOSIS — M170 Bilateral primary osteoarthritis of knee: Secondary | ICD-10-CM | POA: Diagnosis not present

## 2021-04-18 ENCOUNTER — Other Ambulatory Visit: Payer: Self-pay | Admitting: Cardiology

## 2021-04-30 ENCOUNTER — Other Ambulatory Visit: Payer: Self-pay

## 2021-04-30 ENCOUNTER — Telehealth: Payer: Self-pay | Admitting: Cardiology

## 2021-04-30 MED ORDER — DILTIAZEM HCL ER 90 MG PO CP12: ORAL_CAPSULE | ORAL | 1 refills | Status: DC

## 2021-04-30 NOTE — Telephone Encounter (Signed)
Attempted to call pt, no answer. Refill has been sent to [facility] on [address].

## 2021-04-30 NOTE — Telephone Encounter (Signed)
Pt called stating her current pharmacy [facility]) does not carry diltiazem '90mg'$ , 12 hr capsule that was prescribed to her. She says [facility] on [address] told her they could fill the prescription, so she would like someone to send that prescription over to [facility]. Their phone number is [phone]. Pt would like to be notified when complete.

## 2021-05-02 ENCOUNTER — Telehealth: Payer: Self-pay

## 2021-05-02 DIAGNOSIS — I10 Essential (primary) hypertension: Secondary | ICD-10-CM

## 2021-05-02 MED ORDER — DILTIAZEM HCL ER COATED BEADS 180 MG PO CP24: 180.0000 mg | ORAL_CAPSULE | Freq: Every day | ORAL | 2 refills | Status: DC

## 2021-05-02 MED ORDER — DILTIAZEM HCL ER COATED BEADS 180 MG PO CP24
180.0000 mg | ORAL_CAPSULE | Freq: Every day | ORAL | 3 refills | Status: DC
Start: 2021-05-02 — End: 2022-07-27

## 2021-05-02 NOTE — Telephone Encounter (Signed)
Pt called and stated that she is not able to get the Diltiazem 90 mg from any pharmacy because none of them have it in stock. She only has 10 tabs left and has to take it 2x daily. She would like to know if there is anything else she can take instead.

## 2021-05-02 NOTE — Telephone Encounter (Signed)
Called and spoke to pt, pt is aware.

## 2021-05-09 DIAGNOSIS — M545 Low back pain, unspecified: Secondary | ICD-10-CM | POA: Diagnosis not present

## 2021-05-09 DIAGNOSIS — Z79899 Other long term (current) drug therapy: Secondary | ICD-10-CM | POA: Diagnosis not present

## 2021-05-09 DIAGNOSIS — I10 Essential (primary) hypertension: Secondary | ICD-10-CM | POA: Diagnosis not present

## 2021-05-09 DIAGNOSIS — M170 Bilateral primary osteoarthritis of knee: Secondary | ICD-10-CM | POA: Diagnosis not present

## 2021-05-16 IMAGING — CR DG LUMBAR SPINE 1V
2 series · 2 of 2 positions shown · non-contrast
Comparison: MRI lumbar spine dated February 15, 2020.

CLINICAL DATA: L1-L2 microdiscectomy.

EXAM:
LUMBAR SPINE - 1 VIEW

[lateral (1 of 2)]
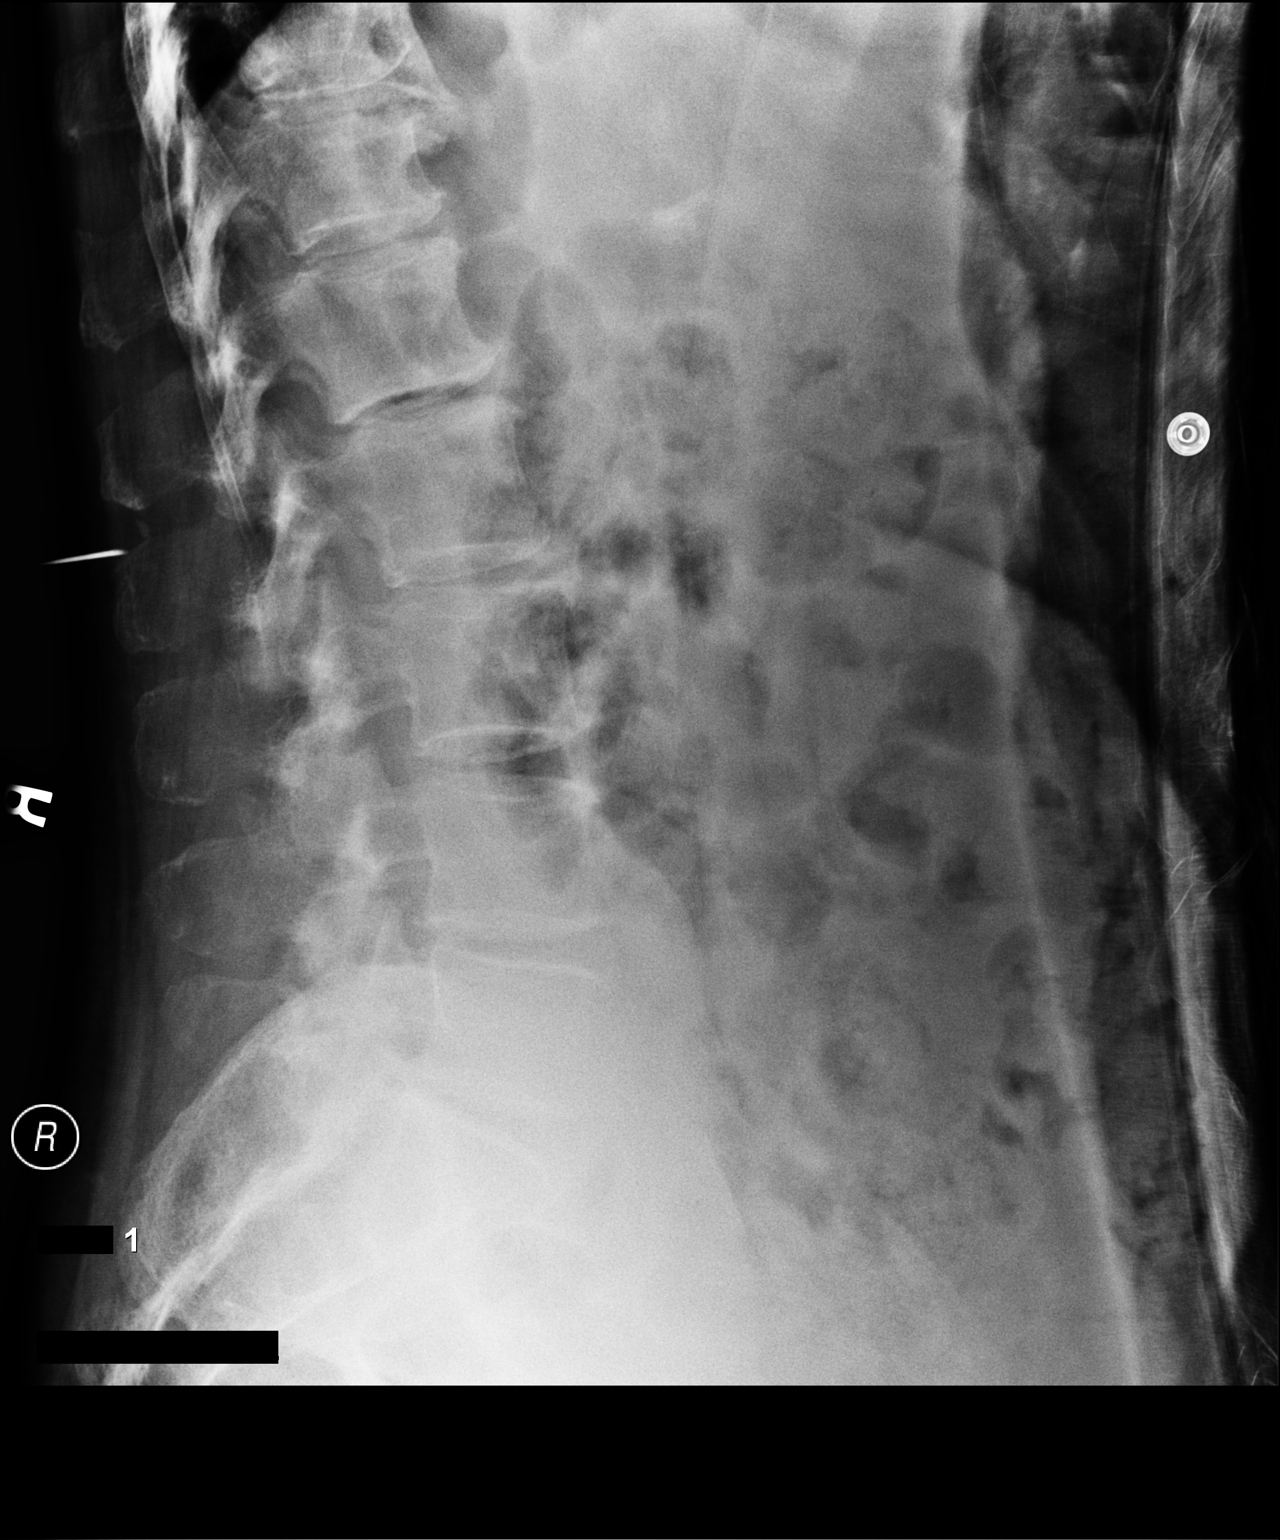

[lateral (2 of 2)]
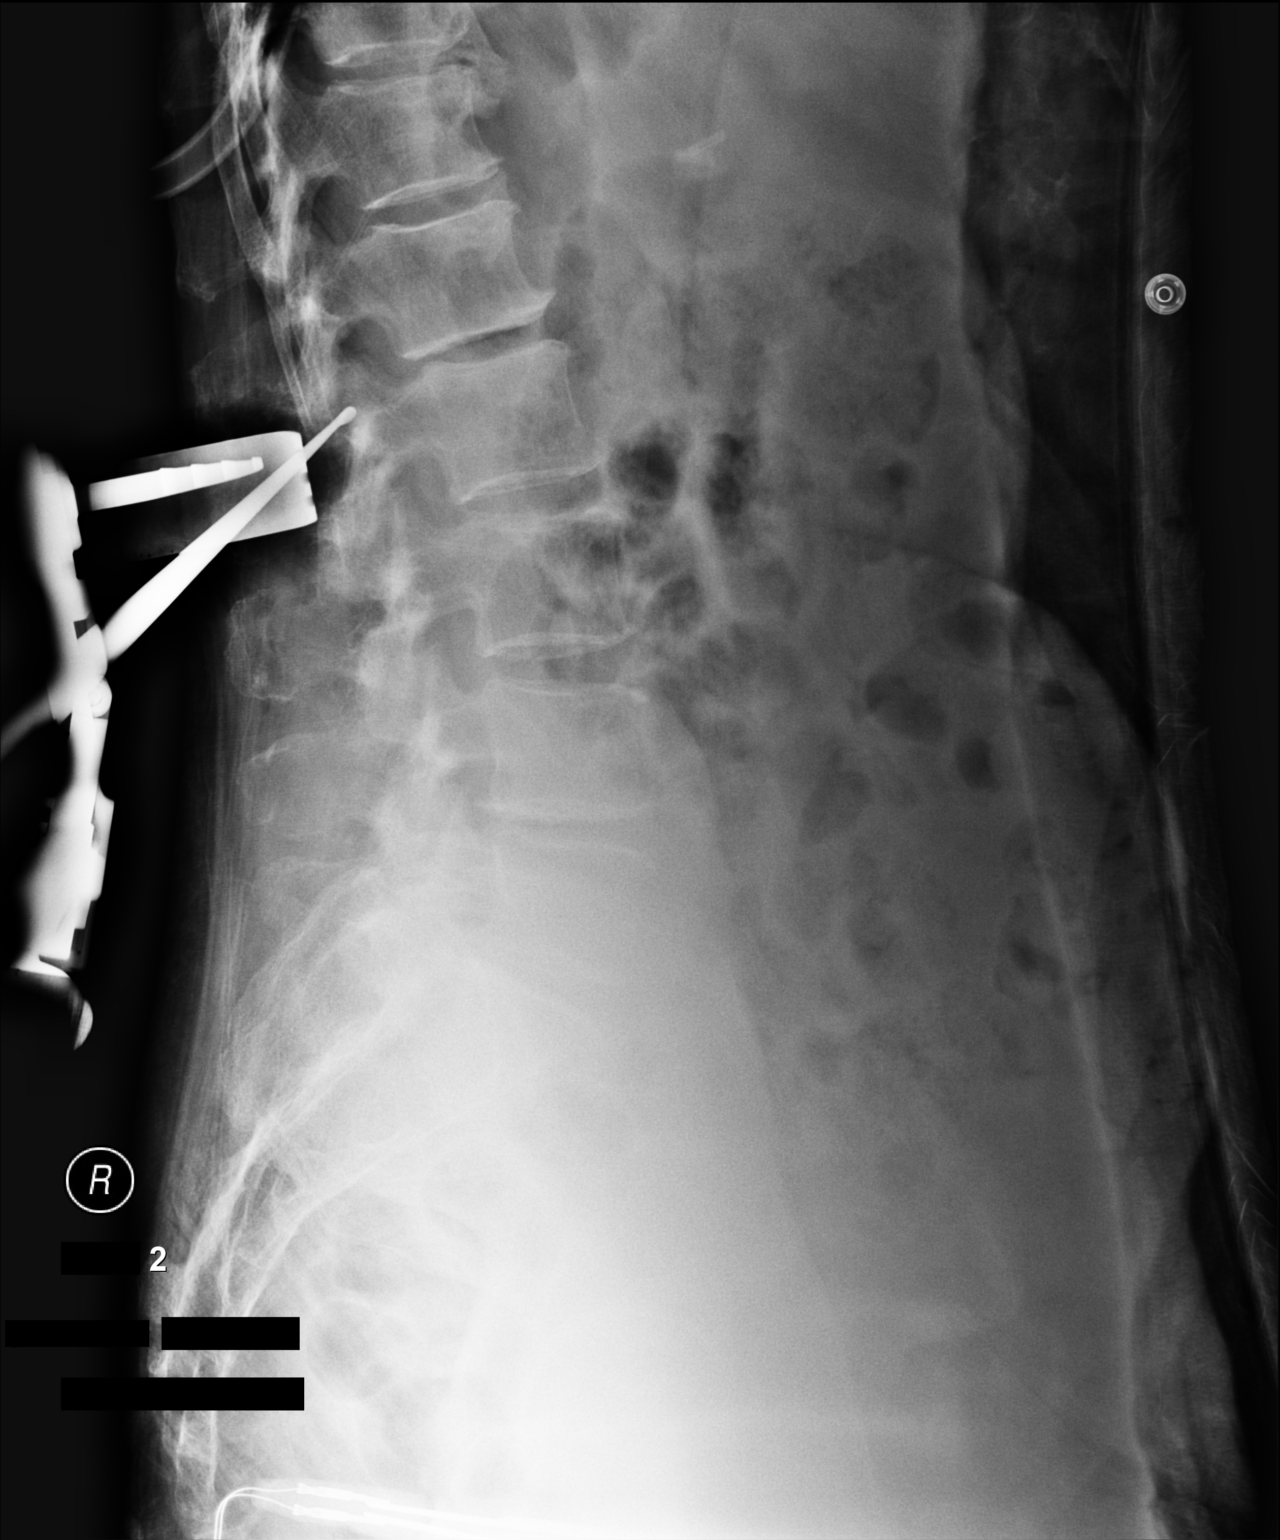

[2 of 2 positions shown; findings below may reference images not displayed]

FINDINGS: Film 1 demonstrates needle localizer at the superior margin of the
L2 spinous process. Film 2 demonstrates surgical instrumentation
posterior to the L2 vertebral body with surgical probe directed
towards the L1-L2 disc space. No acute osseous abnormality.
IMPRESSION: 1. Intraoperative localization for L1-L2 microdiscectomy.

## 2021-05-16 IMAGING — DX DG CHEST 1V PORT
1 series · 1 of 1 positions shown · non-contrast
Comparison: None.

CLINICAL DATA: Central line placement.

EXAM:
PORTABLE CHEST 1 VIEW

[chest]
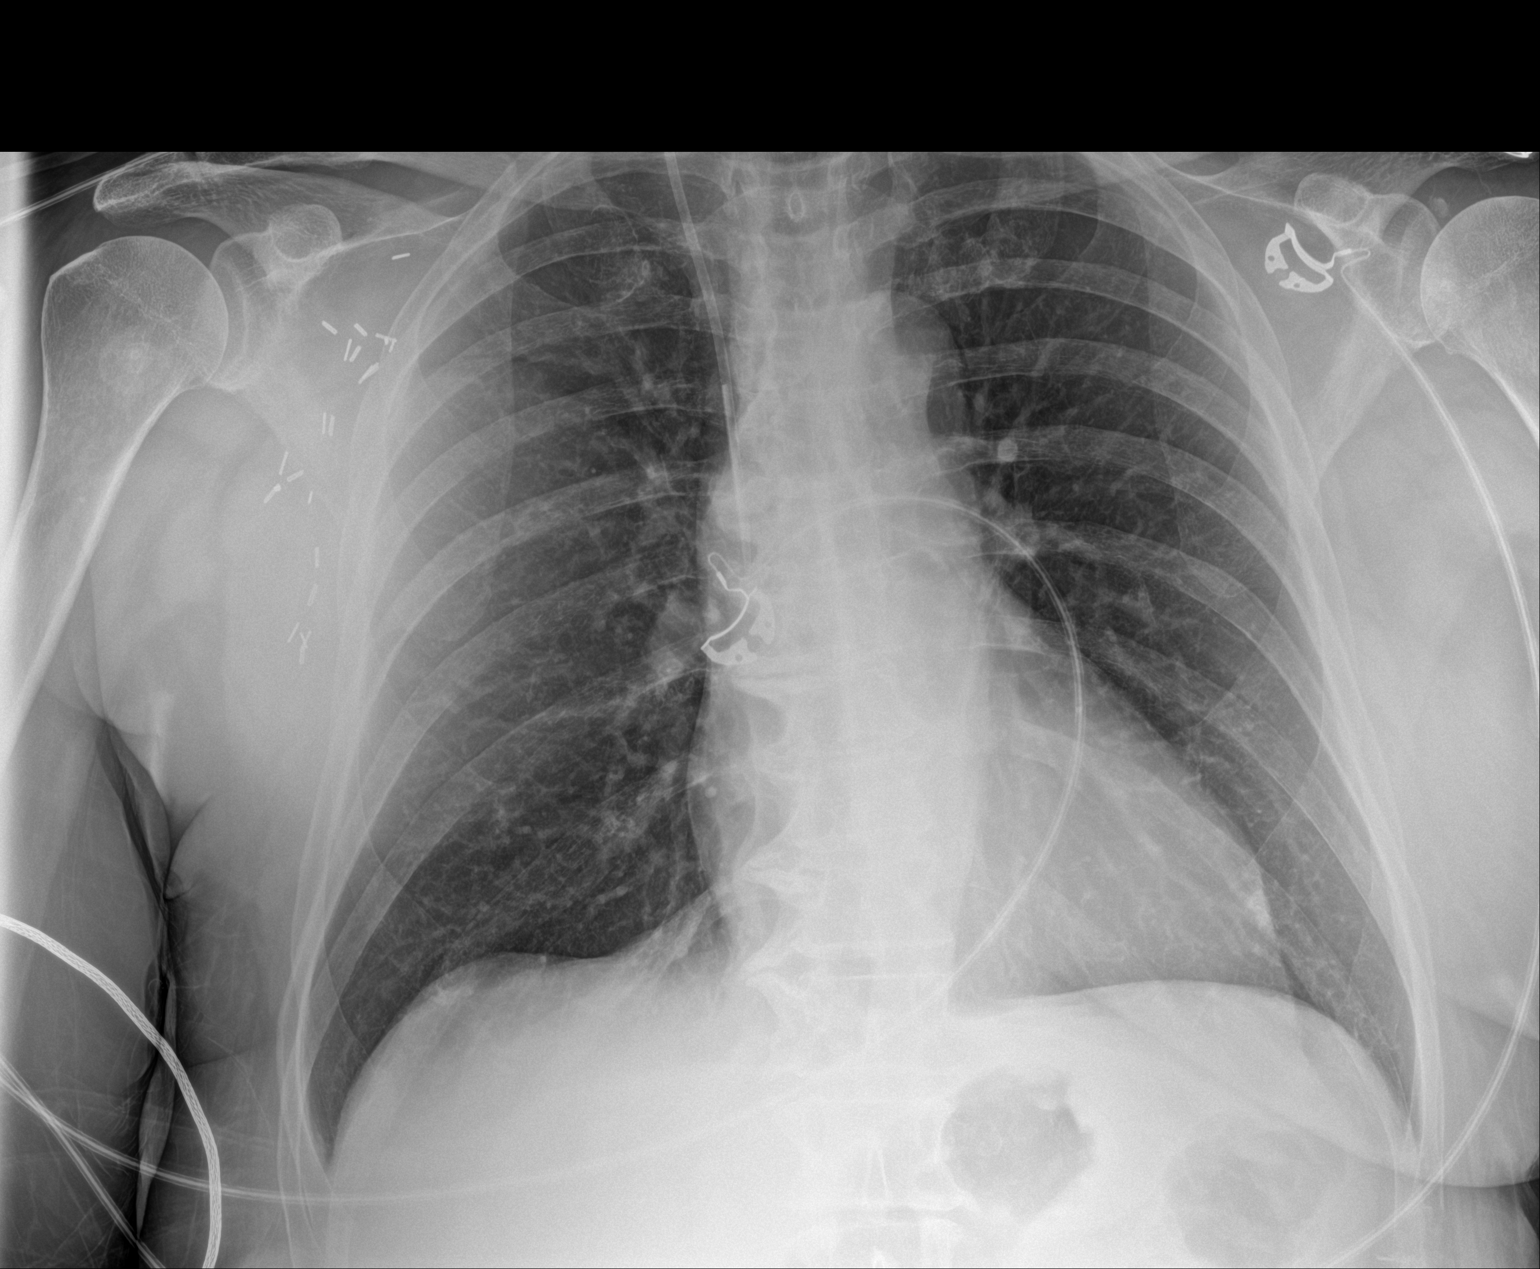

[1 of 1 positions shown; findings below may reference images not displayed]

FINDINGS: Right internal jugular central venous catheter with tip in the
distal SVC. The heart size and mediastinal contours are within
normal limits. Normal pulmonary vascularity. No focal consolidation,
pleural effusion, or pneumothorax. No acute osseous abnormality.
Surgical clips in the right axilla.
IMPRESSION: 1. Appropriately positioned right internal jugular central venous
catheter without complicating feature.
2. No active disease.

## 2021-05-22 DIAGNOSIS — M79643 Pain in unspecified hand: Secondary | ICD-10-CM | POA: Diagnosis not present

## 2021-05-22 DIAGNOSIS — I10 Essential (primary) hypertension: Secondary | ICD-10-CM | POA: Diagnosis not present

## 2021-05-22 DIAGNOSIS — E7800 Pure hypercholesterolemia, unspecified: Secondary | ICD-10-CM | POA: Diagnosis not present

## 2021-05-22 DIAGNOSIS — Z79899 Other long term (current) drug therapy: Secondary | ICD-10-CM | POA: Diagnosis not present

## 2021-05-22 DIAGNOSIS — E559 Vitamin D deficiency, unspecified: Secondary | ICD-10-CM | POA: Diagnosis not present

## 2021-05-22 DIAGNOSIS — M0609 Rheumatoid arthritis without rheumatoid factor, multiple sites: Secondary | ICD-10-CM | POA: Diagnosis not present

## 2021-05-22 DIAGNOSIS — M1990 Unspecified osteoarthritis, unspecified site: Secondary | ICD-10-CM | POA: Diagnosis not present

## 2021-06-02 DIAGNOSIS — M797 Fibromyalgia: Secondary | ICD-10-CM | POA: Diagnosis not present

## 2021-06-02 DIAGNOSIS — I10 Essential (primary) hypertension: Secondary | ICD-10-CM | POA: Diagnosis not present

## 2021-06-02 DIAGNOSIS — Z0000 Encounter for general adult medical examination without abnormal findings: Secondary | ICD-10-CM | POA: Diagnosis not present

## 2021-06-02 DIAGNOSIS — E7800 Pure hypercholesterolemia, unspecified: Secondary | ICD-10-CM | POA: Diagnosis not present

## 2021-06-06 DIAGNOSIS — M545 Low back pain, unspecified: Secondary | ICD-10-CM | POA: Diagnosis not present

## 2021-06-06 DIAGNOSIS — Z1159 Encounter for screening for other viral diseases: Secondary | ICD-10-CM | POA: Diagnosis not present

## 2021-06-06 DIAGNOSIS — I10 Essential (primary) hypertension: Secondary | ICD-10-CM | POA: Diagnosis not present

## 2021-06-06 DIAGNOSIS — M129 Arthropathy, unspecified: Secondary | ICD-10-CM | POA: Diagnosis not present

## 2021-06-06 DIAGNOSIS — E559 Vitamin D deficiency, unspecified: Secondary | ICD-10-CM | POA: Diagnosis not present

## 2021-06-06 DIAGNOSIS — Z79899 Other long term (current) drug therapy: Secondary | ICD-10-CM | POA: Diagnosis not present

## 2021-06-06 DIAGNOSIS — M170 Bilateral primary osteoarthritis of knee: Secondary | ICD-10-CM | POA: Diagnosis not present

## 2021-07-04 DIAGNOSIS — Z79899 Other long term (current) drug therapy: Secondary | ICD-10-CM | POA: Diagnosis not present

## 2021-07-04 DIAGNOSIS — M545 Low back pain, unspecified: Secondary | ICD-10-CM | POA: Diagnosis not present

## 2021-07-04 DIAGNOSIS — M170 Bilateral primary osteoarthritis of knee: Secondary | ICD-10-CM | POA: Diagnosis not present

## 2021-07-04 DIAGNOSIS — I10 Essential (primary) hypertension: Secondary | ICD-10-CM | POA: Diagnosis not present

## 2021-08-01 DIAGNOSIS — Z79899 Other long term (current) drug therapy: Secondary | ICD-10-CM | POA: Diagnosis not present

## 2021-08-01 DIAGNOSIS — M170 Bilateral primary osteoarthritis of knee: Secondary | ICD-10-CM | POA: Diagnosis not present

## 2021-08-01 DIAGNOSIS — I10 Essential (primary) hypertension: Secondary | ICD-10-CM | POA: Diagnosis not present

## 2021-08-01 DIAGNOSIS — M545 Low back pain, unspecified: Secondary | ICD-10-CM | POA: Diagnosis not present

## 2021-09-28 DIAGNOSIS — M170 Bilateral primary osteoarthritis of knee: Secondary | ICD-10-CM | POA: Diagnosis not present

## 2021-09-28 DIAGNOSIS — M129 Arthropathy, unspecified: Secondary | ICD-10-CM | POA: Diagnosis not present

## 2021-09-28 DIAGNOSIS — E559 Vitamin D deficiency, unspecified: Secondary | ICD-10-CM | POA: Diagnosis not present

## 2021-09-28 DIAGNOSIS — M545 Low back pain, unspecified: Secondary | ICD-10-CM | POA: Diagnosis not present

## 2021-09-28 DIAGNOSIS — Z79899 Other long term (current) drug therapy: Secondary | ICD-10-CM | POA: Diagnosis not present

## 2021-09-28 DIAGNOSIS — I10 Essential (primary) hypertension: Secondary | ICD-10-CM | POA: Diagnosis not present

## 2021-10-29 DIAGNOSIS — Z79899 Other long term (current) drug therapy: Secondary | ICD-10-CM | POA: Diagnosis not present

## 2021-10-29 DIAGNOSIS — M1990 Unspecified osteoarthritis, unspecified site: Secondary | ICD-10-CM | POA: Diagnosis not present

## 2021-10-29 DIAGNOSIS — M79643 Pain in unspecified hand: Secondary | ICD-10-CM | POA: Diagnosis not present

## 2021-10-29 DIAGNOSIS — M0609 Rheumatoid arthritis without rheumatoid factor, multiple sites: Secondary | ICD-10-CM | POA: Diagnosis not present

## 2021-10-31 DIAGNOSIS — Z79899 Other long term (current) drug therapy: Secondary | ICD-10-CM | POA: Diagnosis not present

## 2021-10-31 DIAGNOSIS — I10 Essential (primary) hypertension: Secondary | ICD-10-CM | POA: Diagnosis not present

## 2021-10-31 DIAGNOSIS — R7989 Other specified abnormal findings of blood chemistry: Secondary | ICD-10-CM | POA: Diagnosis not present

## 2021-10-31 DIAGNOSIS — M170 Bilateral primary osteoarthritis of knee: Secondary | ICD-10-CM | POA: Diagnosis not present

## 2021-10-31 DIAGNOSIS — M545 Low back pain, unspecified: Secondary | ICD-10-CM | POA: Diagnosis not present

## 2021-11-04 DIAGNOSIS — M0609 Rheumatoid arthritis without rheumatoid factor, multiple sites: Secondary | ICD-10-CM | POA: Diagnosis not present

## 2021-11-27 DIAGNOSIS — E7800 Pure hypercholesterolemia, unspecified: Secondary | ICD-10-CM | POA: Diagnosis not present

## 2021-11-27 DIAGNOSIS — F5101 Primary insomnia: Secondary | ICD-10-CM | POA: Diagnosis not present

## 2021-11-27 DIAGNOSIS — R7309 Other abnormal glucose: Secondary | ICD-10-CM | POA: Diagnosis not present

## 2021-11-27 DIAGNOSIS — I10 Essential (primary) hypertension: Secondary | ICD-10-CM | POA: Diagnosis not present

## 2021-11-27 DIAGNOSIS — D649 Anemia, unspecified: Secondary | ICD-10-CM | POA: Diagnosis not present

## 2021-12-03 DIAGNOSIS — N1832 Chronic kidney disease, stage 3b: Secondary | ICD-10-CM | POA: Diagnosis not present

## 2021-12-03 DIAGNOSIS — E7800 Pure hypercholesterolemia, unspecified: Secondary | ICD-10-CM | POA: Diagnosis not present

## 2021-12-03 DIAGNOSIS — M0609 Rheumatoid arthritis without rheumatoid factor, multiple sites: Secondary | ICD-10-CM | POA: Diagnosis not present

## 2021-12-03 DIAGNOSIS — I10 Essential (primary) hypertension: Secondary | ICD-10-CM | POA: Diagnosis not present

## 2021-12-08 DIAGNOSIS — L821 Other seborrheic keratosis: Secondary | ICD-10-CM | POA: Diagnosis not present

## 2021-12-08 DIAGNOSIS — L820 Inflamed seborrheic keratosis: Secondary | ICD-10-CM | POA: Diagnosis not present

## 2021-12-08 DIAGNOSIS — L570 Actinic keratosis: Secondary | ICD-10-CM | POA: Diagnosis not present

## 2021-12-11 DIAGNOSIS — Z79899 Other long term (current) drug therapy: Secondary | ICD-10-CM | POA: Diagnosis not present

## 2021-12-11 DIAGNOSIS — M545 Low back pain, unspecified: Secondary | ICD-10-CM | POA: Diagnosis not present

## 2021-12-11 DIAGNOSIS — M170 Bilateral primary osteoarthritis of knee: Secondary | ICD-10-CM | POA: Diagnosis not present

## 2021-12-11 DIAGNOSIS — I10 Essential (primary) hypertension: Secondary | ICD-10-CM | POA: Diagnosis not present

## 2021-12-11 DIAGNOSIS — Z6825 Body mass index (BMI) 25.0-25.9, adult: Secondary | ICD-10-CM | POA: Diagnosis not present

## 2021-12-23 DIAGNOSIS — L820 Inflamed seborrheic keratosis: Secondary | ICD-10-CM | POA: Diagnosis not present

## 2021-12-23 DIAGNOSIS — D2272 Melanocytic nevi of left lower limb, including hip: Secondary | ICD-10-CM | POA: Diagnosis not present

## 2021-12-23 DIAGNOSIS — L814 Other melanin hyperpigmentation: Secondary | ICD-10-CM | POA: Diagnosis not present

## 2021-12-23 DIAGNOSIS — L821 Other seborrheic keratosis: Secondary | ICD-10-CM | POA: Diagnosis not present

## 2021-12-23 DIAGNOSIS — D1801 Hemangioma of skin and subcutaneous tissue: Secondary | ICD-10-CM | POA: Diagnosis not present

## 2021-12-25 DIAGNOSIS — M1990 Unspecified osteoarthritis, unspecified site: Secondary | ICD-10-CM | POA: Diagnosis not present

## 2021-12-25 DIAGNOSIS — M0609 Rheumatoid arthritis without rheumatoid factor, multiple sites: Secondary | ICD-10-CM | POA: Diagnosis not present

## 2021-12-25 DIAGNOSIS — Z79899 Other long term (current) drug therapy: Secondary | ICD-10-CM | POA: Diagnosis not present

## 2021-12-25 DIAGNOSIS — M79643 Pain in unspecified hand: Secondary | ICD-10-CM | POA: Diagnosis not present

## 2022-01-22 DIAGNOSIS — M545 Low back pain, unspecified: Secondary | ICD-10-CM | POA: Diagnosis not present

## 2022-01-22 DIAGNOSIS — I10 Essential (primary) hypertension: Secondary | ICD-10-CM | POA: Diagnosis not present

## 2022-01-22 DIAGNOSIS — N1832 Chronic kidney disease, stage 3b: Secondary | ICD-10-CM | POA: Diagnosis not present

## 2022-01-22 DIAGNOSIS — M170 Bilateral primary osteoarthritis of knee: Secondary | ICD-10-CM | POA: Diagnosis not present

## 2022-02-26 DIAGNOSIS — E7800 Pure hypercholesterolemia, unspecified: Secondary | ICD-10-CM | POA: Diagnosis not present

## 2022-02-26 DIAGNOSIS — N1832 Chronic kidney disease, stage 3b: Secondary | ICD-10-CM | POA: Diagnosis not present

## 2022-02-26 DIAGNOSIS — E559 Vitamin D deficiency, unspecified: Secondary | ICD-10-CM | POA: Diagnosis not present

## 2022-02-27 DIAGNOSIS — I10 Essential (primary) hypertension: Secondary | ICD-10-CM | POA: Diagnosis not present

## 2022-02-27 DIAGNOSIS — M545 Low back pain, unspecified: Secondary | ICD-10-CM | POA: Diagnosis not present

## 2022-02-27 DIAGNOSIS — R030 Elevated blood-pressure reading, without diagnosis of hypertension: Secondary | ICD-10-CM | POA: Diagnosis not present

## 2022-02-27 DIAGNOSIS — Z79899 Other long term (current) drug therapy: Secondary | ICD-10-CM | POA: Diagnosis not present

## 2022-02-27 DIAGNOSIS — M130 Polyarthritis, unspecified: Secondary | ICD-10-CM | POA: Diagnosis not present

## 2022-03-05 DIAGNOSIS — N1832 Chronic kidney disease, stage 3b: Secondary | ICD-10-CM | POA: Diagnosis not present

## 2022-03-05 DIAGNOSIS — I890 Lymphedema, not elsewhere classified: Secondary | ICD-10-CM | POA: Diagnosis not present

## 2022-03-05 DIAGNOSIS — G8929 Other chronic pain: Secondary | ICD-10-CM | POA: Diagnosis not present

## 2022-03-05 DIAGNOSIS — I10 Essential (primary) hypertension: Secondary | ICD-10-CM | POA: Diagnosis not present

## 2022-04-04 DIAGNOSIS — E7800 Pure hypercholesterolemia, unspecified: Secondary | ICD-10-CM | POA: Diagnosis not present

## 2022-04-04 DIAGNOSIS — F1120 Opioid dependence, uncomplicated: Secondary | ICD-10-CM | POA: Diagnosis not present

## 2022-04-04 DIAGNOSIS — Z79899 Other long term (current) drug therapy: Secondary | ICD-10-CM | POA: Diagnosis not present

## 2022-04-04 DIAGNOSIS — E559 Vitamin D deficiency, unspecified: Secondary | ICD-10-CM | POA: Diagnosis not present

## 2022-04-06 DIAGNOSIS — M1711 Unilateral primary osteoarthritis, right knee: Secondary | ICD-10-CM | POA: Diagnosis not present

## 2022-04-06 DIAGNOSIS — M545 Low back pain, unspecified: Secondary | ICD-10-CM | POA: Diagnosis not present

## 2022-04-09 DIAGNOSIS — Z79899 Other long term (current) drug therapy: Secondary | ICD-10-CM | POA: Diagnosis not present

## 2022-04-09 DIAGNOSIS — M1990 Unspecified osteoarthritis, unspecified site: Secondary | ICD-10-CM | POA: Diagnosis not present

## 2022-04-09 DIAGNOSIS — M79643 Pain in unspecified hand: Secondary | ICD-10-CM | POA: Diagnosis not present

## 2022-04-09 DIAGNOSIS — M0609 Rheumatoid arthritis without rheumatoid factor, multiple sites: Secondary | ICD-10-CM | POA: Diagnosis not present

## 2022-04-13 ENCOUNTER — Other Ambulatory Visit: Payer: Self-pay | Admitting: Rheumatology

## 2022-04-13 DIAGNOSIS — M25561 Pain in right knee: Secondary | ICD-10-CM

## 2022-04-20 DIAGNOSIS — M1711 Unilateral primary osteoarthritis, right knee: Secondary | ICD-10-CM | POA: Diagnosis not present

## 2022-04-25 ENCOUNTER — Ambulatory Visit
Admission: RE | Admit: 2022-04-25 | Discharge: 2022-04-25 | Disposition: A | Discharge status: Home / Self Care | Payer: Medicare Other | Source: Ambulatory Visit | Attending: Rheumatology | Admitting: Rheumatology

## 2022-04-25 DIAGNOSIS — M25561 Pain in right knee: Secondary | ICD-10-CM | POA: Diagnosis not present

## 2022-04-27 DIAGNOSIS — M1711 Unilateral primary osteoarthritis, right knee: Secondary | ICD-10-CM | POA: Diagnosis not present

## 2022-04-30 DIAGNOSIS — N1832 Chronic kidney disease, stage 3b: Secondary | ICD-10-CM | POA: Diagnosis not present

## 2022-04-30 DIAGNOSIS — I10 Essential (primary) hypertension: Secondary | ICD-10-CM | POA: Diagnosis not present

## 2022-05-04 DIAGNOSIS — M1711 Unilateral primary osteoarthritis, right knee: Secondary | ICD-10-CM | POA: Diagnosis not present

## 2022-05-05 DIAGNOSIS — I890 Lymphedema, not elsewhere classified: Secondary | ICD-10-CM | POA: Diagnosis not present

## 2022-05-05 DIAGNOSIS — N1832 Chronic kidney disease, stage 3b: Secondary | ICD-10-CM | POA: Diagnosis not present

## 2022-05-05 DIAGNOSIS — G8929 Other chronic pain: Secondary | ICD-10-CM | POA: Diagnosis not present

## 2022-05-05 DIAGNOSIS — I10 Essential (primary) hypertension: Secondary | ICD-10-CM | POA: Diagnosis not present

## 2022-05-06 DIAGNOSIS — I10 Essential (primary) hypertension: Secondary | ICD-10-CM | POA: Diagnosis not present

## 2022-05-06 DIAGNOSIS — K219 Gastro-esophageal reflux disease without esophagitis: Secondary | ICD-10-CM | POA: Diagnosis not present

## 2022-05-06 DIAGNOSIS — F1120 Opioid dependence, uncomplicated: Secondary | ICD-10-CM | POA: Diagnosis not present

## 2022-05-06 DIAGNOSIS — Z79899 Other long term (current) drug therapy: Secondary | ICD-10-CM | POA: Diagnosis not present

## 2022-05-06 DIAGNOSIS — E7800 Pure hypercholesterolemia, unspecified: Secondary | ICD-10-CM | POA: Diagnosis not present

## 2022-05-11 DIAGNOSIS — M1711 Unilateral primary osteoarthritis, right knee: Secondary | ICD-10-CM | POA: Diagnosis not present

## 2022-06-05 DIAGNOSIS — K219 Gastro-esophageal reflux disease without esophagitis: Secondary | ICD-10-CM | POA: Diagnosis not present

## 2022-06-05 DIAGNOSIS — N1832 Chronic kidney disease, stage 3b: Secondary | ICD-10-CM | POA: Diagnosis not present

## 2022-06-05 DIAGNOSIS — I10 Essential (primary) hypertension: Secondary | ICD-10-CM | POA: Diagnosis not present

## 2022-06-05 DIAGNOSIS — E7800 Pure hypercholesterolemia, unspecified: Secondary | ICD-10-CM | POA: Diagnosis not present

## 2022-07-06 DIAGNOSIS — Z79899 Other long term (current) drug therapy: Secondary | ICD-10-CM | POA: Diagnosis not present

## 2022-07-13 DIAGNOSIS — M79643 Pain in unspecified hand: Secondary | ICD-10-CM | POA: Diagnosis not present

## 2022-07-13 DIAGNOSIS — Z79899 Other long term (current) drug therapy: Secondary | ICD-10-CM | POA: Diagnosis not present

## 2022-07-13 DIAGNOSIS — M0609 Rheumatoid arthritis without rheumatoid factor, multiple sites: Secondary | ICD-10-CM | POA: Diagnosis not present

## 2022-07-13 DIAGNOSIS — M1990 Unspecified osteoarthritis, unspecified site: Secondary | ICD-10-CM | POA: Diagnosis not present

## 2022-07-26 ENCOUNTER — Other Ambulatory Visit: Payer: Self-pay | Admitting: Cardiology

## 2022-07-27 ENCOUNTER — Other Ambulatory Visit: Payer: Self-pay | Admitting: Cardiology

## 2022-07-27 ENCOUNTER — Other Ambulatory Visit: Payer: Self-pay

## 2022-07-27 MED ORDER — DILTIAZEM HCL ER COATED BEADS 180 MG PO CP24: 180.0000 mg | ORAL_CAPSULE | Freq: Every day | ORAL | 0 refills | Status: DC

## 2022-07-28 ENCOUNTER — Ambulatory Visit: Payer: Medicare Other

## 2022-07-28 VITALS — BP 119/78 | HR 67 | Ht 62.0 in | Wt 175.6 lb

## 2022-07-28 DIAGNOSIS — R55 Syncope and collapse: Secondary | ICD-10-CM

## 2022-07-28 DIAGNOSIS — I10 Essential (primary) hypertension: Secondary | ICD-10-CM

## 2022-07-28 DIAGNOSIS — N289 Disorder of kidney and ureter, unspecified: Secondary | ICD-10-CM | POA: Insufficient documentation

## 2022-07-28 DIAGNOSIS — R42 Dizziness and giddiness: Secondary | ICD-10-CM | POA: Diagnosis not present

## 2022-07-28 MED ORDER — DILTIAZEM HCL ER 120 MG PO TB24: 120.0000 mg | ORAL_TABLET | Freq: Every day | ORAL | 3 refills | Status: DC

## 2022-07-28 NOTE — Progress Notes (Signed)
Primary Physician:  Deon Pilling, NP   Patient ID: Cynthia Farrell, female    DOB: [birth year], 76 y.o.   MRN: [id number]  Subjective:    Chief Complaint  Patient presents with   Follow-up    1 Year    Hypertension         HPI: Cynthia Farrell  is a 76 y.o. female  Caucasian female  with mild hypertension, hyperdynamic LV with intra ventricular pressure gradient by echocardiogram previously, palpitations which have resolved since being on diltiazem, improvement in dyspnea since reducing her heart rate with diltiazem, obesity, history of rheumatoid arthritis, osteo arthritis, fibromyalgia presents here for follow-up of hypertension, chronic dyspnea, recurrent episodes of near syncope that has been occurring over the past 2-3 years felt to be vasovagal.  She presents today for 1 year follow-up.  At last visit she was stable from a cardiovascular standpoint therefore no changes were made.  Since last office visit she has had approximately 5 episodes of dizziness and pre-syncope. Episodes of near syncopal have occurred both during routine activities at home and also while she was sitting, feels disoriented and fatigue after the event.  Describes "spells" and feels like she will pass out and has mental cloudiness, difficulty speaking, and loss of balance. These type of episodes have been ongoing for years and previously felt to be vasovagal. She has been seen by neurologist and plan was to follow-up for an EEG but she has not followed up as of this time. She has also had episode of palpitations occurring 1-2 time per week for the past few months. She has had chronic palpitation in the past and cardiac event monitoring has been overall unrevealing.   Past Medical History:  Diagnosis Date   Atrial fibrillation ([place])    Breast cancer ([place])    breast    Diverticular disease    Dysrhythmia    a remote history in 1990's - per Dr. Irven Shelling notes.   Esophageal stricture    Fibromyalgia    GERD  (gastroesophageal reflux disease)    Heart murmur    Hemorrhoids    Hyperlipemia    Hypertension    Kidney disease    MVP (mitral valve prolapse)    Osteoarthritis    RA   Pneumonia    Rheumatoid aortitis     Past Surgical History:  Procedure Laterality Date   ABDOMINAL HYSTERECTOMY  1995   APPENDECTOMY  1956   BREAST BIOPSY  1972   BREAST LUMPECTOMY Left    CARDIAC CATHETERIZATION  1997 & 2005   CATARACT EXTRACTION Bilateral    CHOLECYSTECTOMY  1999   COLONOSCOPY  02/12/2012   Procedure: COLONOSCOPY;  Surgeon: Inda Castle, MD;  Location: [facility];  Service: Endoscopy;  Laterality: N/A;   DILATION AND CURETTAGE OF UTERUS     ESOPHAGEAL DILATION     ESOPHAGOGASTRODUODENOSCOPY  02/12/2012   Procedure: ESOPHAGOGASTRODUODENOSCOPY (EGD);  Surgeon: Inda Castle, MD;  Location: [facility];  Service: Endoscopy;  Laterality: N/A;   HOT HEMOSTASIS  02/12/2012   Procedure: HOT HEMOSTASIS (ARGON PLASMA COAGULATION/BICAP);  Surgeon: Inda Castle, MD;  Location: [facility];  Service: Endoscopy;  Laterality: N/A;   LUMBAR LAMINECTOMY/DECOMPRESSION MICRODISCECTOMY Left 03/01/2020   Procedure: Left Lumbar One-Two Microdiscectomy;  Surgeon: Erline Levine, MD;  Location: [place];  Service: Neurosurgery;  Laterality: Left;  Left Lumbar One-Two Microdiscectomy   MASTECTOMY Right 1986   with implant   THYROID CYST EXCISION  2000   TONSILLECTOMY  1968   Social History   Tobacco Use   Smoking status: Never   Smokeless tobacco: Never  Substance Use Topics   Alcohol use: No  Marital Status: Married   Review of Systems  Cardiovascular:  Positive for near-syncope. Negative for chest pain, dyspnea on exertion and leg swelling.  Musculoskeletal:  Positive for arthritis and back pain.  Gastrointestinal:  Negative for melena.  Neurological:  Positive for dizziness, light-headedness and loss of balance.   Objective:      07/28/2022    2:22 PM 03/31/2021    2:03 PM 03/28/2020    3:15  PM  Vitals with BMI  Height '5\' 2"'$  '5\' 2"'$  '5\' 2"'$   Weight 175 lbs 10 oz 185 lbs 10 oz 194 lbs  BMI 32.11 07.37 10.62  Systolic 694 854 627  Diastolic 78 81 67  Pulse 67 68 82    Orthostatic VS for the past 72 hrs (Last 3 readings):  Patient Position BP Location Cuff Size  07/28/22 1422 Sitting Left Arm Large     Physical Exam Vitals reviewed.  Constitutional:      Appearance: She is obese.     Comments: Mildly obese  Neck:     Vascular: Carotid bruit (Bilateral) present. No JVD.  Cardiovascular:     Rate and Rhythm: Normal rate and regular rhythm.     Pulses: Normal pulses and intact distal pulses.          Dorsalis pedis pulses are 2+ on the right side and 2+ on the left side.       Posterior tibial pulses are 2+ on the right side and 2+ on the left side.     Heart sounds: Murmur heard.     Crescendo early systolic murmur is present with a grade of 2/6 at the upper right sternal border and apex.  Pulmonary:     Effort: Pulmonary effort is normal. No accessory muscle usage or respiratory distress.     Breath sounds: Normal breath sounds.  Abdominal:     General: Bowel sounds are normal.     Palpations: Abdomen is soft.  Musculoskeletal:        General: No swelling. Normal range of motion.  Neurological:     Mental Status: She is oriented to person, place, and time.    Radiology: No results found.  Laboratory examination:    PRN Meds:. Medications Discontinued During This Encounter  Medication Reason   diltiazem (CARDIZEM CD) 180 MG 24 hr capsule Discontinued by provider   cyanocobalamin 2000 MCG tablet Discontinued by provider   Vitamin D, Ergocalciferol, (DRISDOL) 1.25 MG (50000 UNIT) CAPS capsule Completed Course   diltiazem (CARDIZEM LA) 120 MG 24 hr tablet Reorder    Current Outpatient Medications  Medication Instructions   alendronate (FOSAMAX) 70 mg, Oral, Weekly   amoxicillin (AMOXIL) 2,000 mg, Oral, See admin instructions, Take 4 capsules prior to having  teeth cleaned or dental procedure    Biotin 1,000 mcg, Oral, Daily   calcium carbonate (OS-CAL - DOSED IN MG OF ELEMENTAL CALCIUM) 1250 (500 Ca) MG tablet 1 tablet, Oral, Daily   carisoprodol (SOMA) 250 mg, Oral, Every 8 hours PRN   Cholecalciferol (D3 ADULT PO) Oral   diltiazem (CARDIZEM LA) 120 mg, Oral, Daily   ezetimibe (ZETIA) 10 mg, Oral, Daily   Farxiga 10 mg, Oral, Daily   fluvastatin (LESCOL) 20 mg, Oral, Daily   folic acid (FOLVITE) 035 mcg, Daily   HYDROcodone-acetaminophen (NORCO) 10-325 MG tablet 1 tablet,  Oral, 2 times daily PRN   leflunomide (ARAVA) 20 mg, Oral, Daily   losartan (COZAAR) 25 mg, Oral, Daily   omeprazole-sodium bicarbonate (ZEGERID) 40-1100 MG capsule 1 capsule, Oral, Daily   zolpidem (AMBIEN) 5 mg, Oral, Daily at bedtime    Cardiac Studies:   Cardiac cath 2002: Hyperdynamic LV and normal coronary arteries: Done for chest pain and exercise treadmill stress  exercise-induced nonsustained ventricular tachycardia.  Catherization 1994 and 1995 Normal  30 day event monitor 07/27/2018-08/25/2018: 2 patient triggered events with reported symptoms related with normal sinus rhythm with first-degree block. Lowest bradycardiac episode occurred on day 19 (11/24) at 10:31 AM with HR at 44 bpm that patient stated she did have symptoms of near syncope during that time. No high degree AV block, A. fib, or SVT noted.   Echocardiogram 06/13/2018: Left ventricle cavity is normal in size. Normal global wall motion. Doppler evidence of grade I (impaired) diastolic dysfunction, normal LAP. Calculated EF 66%.  Left atrial cavity is mildly dilated at 4 cm and 52 mL. Structurally normal trileaflet aortic valve with no regurgitation noted. There is mild pressure gradient across the AV with mean of 10 mm Hg and peak 18 mm Hg, AVA 1.99 cm. Compared to the study done on 03/23/2017, no significant change.   Carotid artery duplex 06/13/2018: No hemodynamically significant arterial disease in  the internal carotid artery bilaterally. No significant plaque noted as well. Antegrade right vertebral artery flow. Antegrade left vertebral artery flow. Compared to the study done on 02/06/2015, no significant change. Incidental right thyroid nodule noted. Consider dedicated scan.  EKG:  EKG 07/28/2022: Sinus bradycardia at rate of 59 bpm.  Normal axis.  Incomplete right bundle branch block.  No evidence of ischemia, injury pattern.  Compared to previous EKG on 04/01/2019, no significant change.  Assessment:     ICD-10-CM   1. Dizziness  R42     2. Pre-syncope  R55     3. Primary hypertension  I10 EKG 12-Lead       Recommendations:   Cynthia Farrell  is a 76 y.o. Caucasian female  with mild hypertension, hyperdynamic LV with intra ventricular pressure gradient by echocardiogram previously, palpitations which have resolved since being on diltiazem, improvement in dyspnea since reducing her heart rate with diltiazem, obesity, history of rheumatoid arthritis, osteo arthritis, fibromyalgia presents here for follow-up of hypertension, chronic dyspnea, recurrent episodes of near syncope that has been occurring over the past 2-3 years felt to be vasovagal.  Dizziness Pre-syncope Describes pre-syncopal "spells" and feels like she will pass out and has mental cloudiness, difficulty speaking, and loss of balance. These type of episodes have been ongoing for years and felt to be vasovagal.  She has been seen by neurologist and plan was to follow-up for an EEG but she has not followed up as of this time. She does also has a history of chronic palpitations which had ultimately resolved on diltiazem however she does report that she has had episodes of palpitations occurring 1-2 times a week for the past few months. She has had cardiac event monitors in the past that were overall unrevealing. Given ongoing episodes of presyncope and palpitations we will schedule for loop recorder implantation with Dr. Einar Gip.   This was discussed with Dr. Einar Gip.  Primary hypertension No clinical evidence of heart failure, blood pressure is also well controlled. No change in physical exam including 3/6 midsystolic murmur at the apex. Continue current medications without changes.  Follow-up after loop recorder  implantation.   Ernst Spell, AGNP-C 07/28/2022, 3:22 PM Office: [phone]

## 2022-07-29 ENCOUNTER — Telehealth: Payer: Self-pay

## 2022-07-29 ENCOUNTER — Other Ambulatory Visit: Payer: Self-pay

## 2022-07-29 ENCOUNTER — Other Ambulatory Visit: Payer: Self-pay | Admitting: Family Medicine

## 2022-07-29 DIAGNOSIS — R42 Dizziness and giddiness: Secondary | ICD-10-CM

## 2022-07-29 DIAGNOSIS — Z1231 Encounter for screening mammogram for malignant neoplasm of breast: Secondary | ICD-10-CM

## 2022-07-29 DIAGNOSIS — R55 Syncope and collapse: Secondary | ICD-10-CM

## 2022-07-29 NOTE — Telephone Encounter (Signed)
Pt needs referral back to [facility] and order for EEG as per ov discussion w/ JG/BS

## 2022-07-29 NOTE — Telephone Encounter (Signed)
[employer]

## 2022-07-29 NOTE — Telephone Encounter (Signed)
Ph [phone] Fax [phone]

## 2022-07-30 DIAGNOSIS — E7800 Pure hypercholesterolemia, unspecified: Secondary | ICD-10-CM | POA: Diagnosis not present

## 2022-07-30 DIAGNOSIS — I10 Essential (primary) hypertension: Secondary | ICD-10-CM | POA: Diagnosis not present

## 2022-07-30 DIAGNOSIS — N1832 Chronic kidney disease, stage 3b: Secondary | ICD-10-CM | POA: Diagnosis not present

## 2022-07-30 DIAGNOSIS — R7303 Prediabetes: Secondary | ICD-10-CM | POA: Diagnosis not present

## 2022-08-05 DIAGNOSIS — M170 Bilateral primary osteoarthritis of knee: Secondary | ICD-10-CM | POA: Diagnosis not present

## 2022-08-05 DIAGNOSIS — E7800 Pure hypercholesterolemia, unspecified: Secondary | ICD-10-CM | POA: Diagnosis not present

## 2022-08-05 DIAGNOSIS — Z79899 Other long term (current) drug therapy: Secondary | ICD-10-CM | POA: Diagnosis not present

## 2022-08-05 DIAGNOSIS — I10 Essential (primary) hypertension: Secondary | ICD-10-CM | POA: Diagnosis not present

## 2022-08-05 DIAGNOSIS — N1832 Chronic kidney disease, stage 3b: Secondary | ICD-10-CM | POA: Diagnosis not present

## 2022-08-06 DIAGNOSIS — I10 Essential (primary) hypertension: Secondary | ICD-10-CM | POA: Diagnosis not present

## 2022-08-06 DIAGNOSIS — M797 Fibromyalgia: Secondary | ICD-10-CM | POA: Diagnosis not present

## 2022-08-06 DIAGNOSIS — Z0000 Encounter for general adult medical examination without abnormal findings: Secondary | ICD-10-CM | POA: Diagnosis not present

## 2022-08-06 DIAGNOSIS — E7800 Pure hypercholesterolemia, unspecified: Secondary | ICD-10-CM | POA: Diagnosis not present

## 2022-08-18 ENCOUNTER — Ambulatory Visit: Payer: Medicare Other | Admitting: Cardiology

## 2022-09-04 DIAGNOSIS — N1832 Chronic kidney disease, stage 3b: Secondary | ICD-10-CM | POA: Diagnosis not present

## 2022-09-04 DIAGNOSIS — Z79899 Other long term (current) drug therapy: Secondary | ICD-10-CM | POA: Diagnosis not present

## 2022-09-04 DIAGNOSIS — M170 Bilateral primary osteoarthritis of knee: Secondary | ICD-10-CM | POA: Diagnosis not present

## 2022-09-04 DIAGNOSIS — I10 Essential (primary) hypertension: Secondary | ICD-10-CM | POA: Diagnosis not present

## 2022-09-04 DIAGNOSIS — R030 Elevated blood-pressure reading, without diagnosis of hypertension: Secondary | ICD-10-CM | POA: Diagnosis not present

## 2022-09-29 ENCOUNTER — Ambulatory Visit
Admission: RE | Admit: 2022-09-29 | Discharge: 2022-09-29 | Disposition: A | Discharge status: Home / Self Care | Payer: BLUE CROSS/BLUE SHIELD | Source: Ambulatory Visit | Attending: Family Medicine | Admitting: Family Medicine

## 2022-09-29 DIAGNOSIS — Z1231 Encounter for screening mammogram for malignant neoplasm of breast: Secondary | ICD-10-CM

## 2022-10-05 DIAGNOSIS — M25561 Pain in right knee: Secondary | ICD-10-CM | POA: Diagnosis not present

## 2022-10-05 DIAGNOSIS — M170 Bilateral primary osteoarthritis of knee: Secondary | ICD-10-CM | POA: Diagnosis not present

## 2022-10-05 DIAGNOSIS — Z79899 Other long term (current) drug therapy: Secondary | ICD-10-CM | POA: Diagnosis not present

## 2022-10-05 DIAGNOSIS — I10 Essential (primary) hypertension: Secondary | ICD-10-CM | POA: Diagnosis not present

## 2022-10-05 DIAGNOSIS — M25562 Pain in left knee: Secondary | ICD-10-CM | POA: Diagnosis not present

## 2022-10-05 DIAGNOSIS — Z0130 Encounter for examination of blood pressure without abnormal findings: Secondary | ICD-10-CM | POA: Diagnosis not present

## 2022-10-05 DIAGNOSIS — Z6831 Body mass index (BMI) 31.0-31.9, adult: Secondary | ICD-10-CM | POA: Diagnosis not present

## 2022-10-05 DIAGNOSIS — M549 Dorsalgia, unspecified: Secondary | ICD-10-CM | POA: Diagnosis not present

## 2022-10-13 DIAGNOSIS — M1990 Unspecified osteoarthritis, unspecified site: Secondary | ICD-10-CM | POA: Diagnosis not present

## 2022-10-13 DIAGNOSIS — M79643 Pain in unspecified hand: Secondary | ICD-10-CM | POA: Diagnosis not present

## 2022-10-13 DIAGNOSIS — M0609 Rheumatoid arthritis without rheumatoid factor, multiple sites: Secondary | ICD-10-CM | POA: Diagnosis not present

## 2022-10-13 DIAGNOSIS — Z79899 Other long term (current) drug therapy: Secondary | ICD-10-CM | POA: Diagnosis not present

## 2022-10-14 ENCOUNTER — Ambulatory Visit: Payer: Medicare Other | Admitting: Neurology

## 2022-10-14 ENCOUNTER — Encounter: Payer: Self-pay | Admitting: Neurology

## 2022-10-14 VITALS — Ht 62.0 in | Wt 177.0 lb

## 2022-10-14 DIAGNOSIS — R42 Dizziness and giddiness: Secondary | ICD-10-CM

## 2022-10-14 DIAGNOSIS — R55 Syncope and collapse: Secondary | ICD-10-CM

## 2022-10-14 NOTE — Progress Notes (Signed)
[facility]    Provider:  Dr Jaynee Eagles Requesting Provider: Ernst Spell, NP Primary Care Provider:  Deon Pilling, NP  CC:  dizziness   10/14/2022;  Here for years of dizziness, extensive workup in the past by [place] and cardiology, cardiology Dxed with vasovagal syncope, mri brain in the past unremarkable left mastoid effusion in 2021 by Dr. Posey Pronto, Chart reviewed: In the past scheduled a DAT scan for tremor evaluation doesn't appear to ever have been completed. We have seen her as far back as 2016 for dizziness with a complete workup and no neurologic etiology. Back for the same. 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  Tremor is stable, never worsened, never progressed so unlikely PD do not need DAT Scan we had spoken about. Still having dizzy spells, here with her husband, Dxed with Vasovagal by cardiology, always when standing or walking, ongoing for years over 10 years, MRI in 2021 did not show strokes, pre-syncope, worse if getting up too fast (not wearing compression stockings today), starts lightheaded, felt like she was going to black out, sitting resolves and it goes away, no loss of consciousness, or altered mental status, or shaking or focal weakness, sit down for a while and they everything is back to normal. No other focal neurologic deficits, associated symptoms, inciting events or modifiable factors.  Reviewed notes, labs and imaging from outside physicians, which showed :     Latest Ref Rng & Units 10/14/2022    3:34 PM 02/26/2020    1:58  PM 02/10/2020    4:08 PM  CMP  Glucose 70 - 99 mg/dL 71  168  106   BUN 8 - 27 mg/dL '28  31  26   '$ Creatinine 0.57 - 1.00 mg/dL 1.36  1.21  1.37   Sodium 134 - 144 mmol/L 142  139  141   Potassium 3.5 - 5.2 mmol/L 5.3  5.2  4.6   Chloride 96 - 106 mmol/L 105  102  104   CO2 20 - 29 mmol/L '23  27  29   '$ Calcium 8.7 - 10.3 mg/dL 9.6  8.9  8.8   Total Protein 6.5 - 8.1 g/dL  6.0    Total Bilirubin 0.3 - 1.2 mg/dL  0.9    Alkaline Phos 38 - 126 U/L  106    AST 15 - 41 U/L  18    ALT 0 - 44 U/L  24        Latest Ref Rng & Units 02/26/2020    1:58 PM 02/10/2020    4:08 PM 04/10/2012    2:35 AM  CBC  WBC 4.0 - 10.5 K/uL 12.0  9.1  10.0   Hemoglobin 12.0 - 15.0 g/dL 13.6  12.0  12.3   Hematocrit 36.0 - 46.0 % 44.7  39.3  38.9   Platelets 150 - 400 K/uL 273  319  273     MRI brain: EXAM: MRI HEAD WITHOUT CONTRAST   TECHNIQUE: Multiplanar, multiecho pulse sequences of the brain and surrounding structures were obtained without intravenous contrast.   COMPARISON:  None.   FINDINGS: Brain: There is no acute infarction or intracranial hemorrhage. There is no intracranial mass, mass effect,  or edema. There is no hydrocephalus or extra-axial fluid collection. Ventricles and sulci are normal in size and configuration. Patchy T2 hyperintensity in the supratentorial white matter is nonspecific but may reflect mild chronic microvascular ischemic changes.   Vascular: Major vessel flow voids at the skull base are preserved.   Skull and upper cervical spine: Normal marrow signal is preserved.   Sinuses/Orbits: Mild mucosal thickening. Bilateral lens replacements.   Other: Sella is unremarkable.  Left mastoid effusion.   IMPRESSION: No acute infarction, hemorrhage, or mass.   Nonspecific left mastoid effusion.   HPI 11/15/2019:  Cynthia Farrell is a 77 y.o. female here as requested by Ernst Spell, NP for tremor.  I saw patient in 2016 for dizziness and work-up neurologically was  unremarkable.  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  I reviewed Dr. Julianne Rice notes, patient referred here for tremor, labs collected September 26, 2019 include CBC which is unremarkable, BUN 20, creatinine 1.03, CMP unremarkable, B12 1634, I do not see any history on tremor in his notes.  Started 2 years ago, noticed unsteady, pictures when she takes them are blurred. Not at rest but with action like trying to pour things. Worse when she is active, working in the yard, can be moreso when she is tired. About a year ago a family membr noticed. Slowly worsening. Maternal uncle with parkinson's disease, her paternal first cousin with parkinson's disease. Started in the right hand and now in te left as not as bad. She has back issues and she is slowing down which may be due to the back issues. No shuffling. Loss of smell and taste. The foods she used to taste she can;t taste it about a year. No recent falls. No drooling, she has a runny nose especially in the fall she relates to allergies,    Reviewed notes, labs and imaging from outside physicians, which showed :  Personally reviewed MRI brain 09/2019 images and agree with the following: FINDINGS: Brain: There is no acute infarction or intracranial hemorrhage. There is no intracranial mass, mass effect, or edema. There is no hydrocephalus or extra-axial fluid collection. Ventricles and sulci are normal in size and configuration. Patchy T2 hyperintensity in the supratentorial white matter is nonspecific but may reflect mild chronic  microvascular ischemic changes.   Vascular: Major vessel flow voids at the skull base are preserved.   Skull and upper cervical spine: Normal marrow signal is preserved.   Sinuses/Orbits: Mild mucosal thickening. Bilateral lens replacements.   Other: Sella is unremarkable.  Left mastoid effusion.   IMPRESSION: No acute infarction, hemorrhage, or mass.   Nonspecific left mastoid effusion.  Review of Systems: Patient complains of symptoms per HPI as well as the following symptoms: tremor, back pain, joint pain. Pertinent negatives and positives per HPI. All others negative.   Social History   Socioeconomic History   Marital status: Married    Spouse name: Mersadez Linden.   Number of children: 2   Years of education: 12   Highest education level: Not on file  Occupational History   Occupation: Sales    Comment: [employer]  Tobacco Use   Smoking status: Never   Smokeless tobacco: Never  Vaping Use   Vaping Use: Never used  Substance and Sexual Activity   Alcohol use: No   Drug use: No   Sexual activity: Not on file  Other Topics Concern   Not on file  Social History Narrative   Lives at home with husband.   Caffeine use: Drinks decaf tea   Right handed   Social Determinants of Health   Financial Resource Strain: Low Risk  (03/15/2020)   Overall Financial Resource Strain (CARDIA)    Difficulty of Paying Living Expenses: Not very hard  Food Insecurity: No Food Insecurity (03/15/2020)   Hunger Vital Sign    Worried About Running Out of Food in the Last Year: Never true    Ran Out of Food in the Last Year: Never true  Transportation Needs: No Transportation Needs (03/15/2020)   PRAPARE - Hydrologist (Medical): No    Lack of Transportation (Non-Medical): No  Physical Activity: Inactive (03/15/2020)   Exercise Vital Sign    Days of Exercise per Week: 0 days    Minutes of Exercise per Session: 0 min  Stress: No Stress Concern Present  (03/15/2020)   [place]    Feeling of Stress : Only a little  Social Connections: Socially Integrated (03/15/2020)   Social Connection and Isolation Panel [NHANES]    Frequency of Communication with Friends and Family: More than three times a week    Frequency of Social Gatherings with Friends and Family: Once a week    Attends Religious Services: 1 to 4 times per year    Active Member of [employer] or Organizations: Yes    Attends Archivist Meetings: 1 to 4 times per year    Marital Status: Married  Human resources officer Violence: Not At Risk (03/15/2020)   Humiliation, Afraid, Rape, and Kick questionnaire    Fear of Current or Ex-Partner: No    Emotionally Abused: No    Physically Abused: No    Sexually Abused: No    Family History  Problem Relation Age of Onset   Diabetes Mother    Heart disease Mother    Heart disease Father    Heart attack Father    Heart disease Brother    Frontotemporal dementia Brother    Heart disease Brother    Kidney disease Maternal Grandmother    Liver disease Maternal Grandfather    Heart disease Paternal Grandmother    Breast cancer Paternal Grandmother    Heart disease Paternal Grandfather    Parkinson's disease Maternal Uncle    Parkinson's disease Cousin        father's side   Breast cancer Niece    Breast cancer Niece    Colon cancer Neg Hx     Past Medical History:  Diagnosis Date   Atrial fibrillation ([place])    Breast cancer ([place])    breast    Diverticular disease    Dysrhythmia    a remote history in 1990's - per Dr. Irven Shelling notes.   Esophageal stricture    Fibromyalgia    GERD (gastroesophageal reflux disease)    Heart murmur    Hemorrhoids    Hyperlipemia    Hypertension    Kidney disease    MVP (mitral valve prolapse)    Osteoarthritis    RA   Pneumonia    Rheumatoid aortitis    Stage 3 chronic kidney disease [facility])     Patient Active Problem List  Diagnosis Date Noted   Kidney disease 07/28/2022   Herniated lumbar disc without myelopathy 03/01/2020   Tremor 11/15/2019   Chronic venous insufficiency 05/15/2016   Lymphedema 05/15/2016   Pulsatile tinnitus of right ear 01/16/2015   Right-sided headache 01/16/2015   Vertigo 01/16/2015   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   Pre-syncope 01/16/2015   Dizziness and giddiness 01/16/2015   Word finding difficulty 01/16/2015   Altered awareness, transient 01/16/2015   Rheumatoid arthritis(714.0) 03/28/2012   Blood in stool 02/12/2012   Acute posthemorrhagic anemia 02/12/2012   Acute pyloric channel ulcer 02/12/2012   Gastric AVM 02/12/2012   Stricture and stenosis of esophagus 02/12/2012   Fibromyalgia 02/11/2012   Aortic insufficiency and aortic stenosis 02/11/2012   Hx of breast cancer 02/11/2012   Hx of adenomatous colonic polyps 02/11/2012   GERD with stricture 02/11/2012    Past Surgical History:  Procedure Laterality Date   [place]   BREAST LUMPECTOMY Left    CARDIAC CATHETERIZATION  1997 & 2005   CATARACT EXTRACTION Bilateral    CHOLECYSTECTOMY  1999   COLONOSCOPY  02/12/2012   Procedure: COLONOSCOPY;  Surgeon: Inda Castle, MD;  Location: [facility];  Service: Endoscopy;  Laterality: N/A;   DILATION AND CURETTAGE OF UTERUS     ESOPHAGEAL DILATION     ESOPHAGOGASTRODUODENOSCOPY  02/12/2012   Procedure: ESOPHAGOGASTRODUODENOSCOPY (EGD);  Surgeon: Inda Castle, MD;  Location: [facility];  Service: Endoscopy;  Laterality: N/A;   HOT HEMOSTASIS  02/12/2012   Procedure: HOT HEMOSTASIS (ARGON PLASMA COAGULATION/BICAP);  Surgeon: Inda Castle, MD;  Location: [facility];  Service: Endoscopy;  Laterality: N/A;   LUMBAR LAMINECTOMY/DECOMPRESSION MICRODISCECTOMY Left 03/01/2020   Procedure: Left Lumbar One-Two Microdiscectomy;  Surgeon: Erline Levine, MD;  Location: [facility];  Service:  Neurosurgery;  Laterality: Left;  Left Lumbar One-Two Microdiscectomy   MASTECTOMY Right 1986   with implant   THYROID CYST EXCISION  2000   TONSILLECTOMY  1968    Current Outpatient Medications  Medication Sig Dispense Refill   alendronate (FOSAMAX) 70 MG tablet Take 70 mg by mouth once a week.     amoxicillin (AMOXIL) 500 MG capsule Take 2,000 mg by mouth See admin instructions. Take 4 capsules prior to having teeth cleaned or dental procedure     Biotin 1000 MCG tablet Take 1,000 mcg by mouth daily.     calcium carbonate (OS-CAL - DOSED IN MG OF ELEMENTAL CALCIUM) 1250 (500 Ca) MG tablet Take 1 tablet by mouth daily.     carisoprodol (SOMA) 250 MG tablet Take 250 mg by mouth every 8 (eight) hours as needed (for muscle spasms).      Cholecalciferol (D3 ADULT PO) Take by mouth.     diltiazem (CARDIZEM LA) 120 MG 24 hr tablet Take 1 tablet (120 mg total) by mouth daily. 90 tablet 3   ezetimibe (ZETIA) 10 MG tablet Take 10 mg by mouth daily.     FARXIGA 10 MG TABS tablet Take 10 mg by mouth daily.     fluticasone (FLONASE) 50 MCG/ACT nasal spray SMARTSIG:1-2 Spray(s) Both Nares Every Night     fluvastatin (LESCOL) 20 MG capsule Take 20 mg by mouth daily.     folic acid (FOLVITE) 119 MCG tablet Take 800 mcg by mouth daily.     HYDROcodone-acetaminophen (NORCO) 10-325 MG tablet Take 1 tablet by mouth 3 (three) times daily as needed for moderate pain.  Most takes twice daily     leflunomide (ARAVA) 20 MG tablet Take 20 mg by mouth daily.     losartan (COZAAR) 25 MG tablet Take 25 mg by mouth daily.     omeprazole-sodium bicarbonate (ZEGERID) 40-1100 MG capsule Take 1 capsule by mouth daily.      zolpidem (AMBIEN) 10 MG tablet Take 5 mg by mouth at bedtime.      No current facility-administered medications for this visit.    Allergies as of 10/14/2022   (No Known Allergies)    Vitals: Ht '5\' 2"'$  (1.575 m)   Wt 177 lb (80.3 kg)   BMI 32.37 kg/m  Last Weight:  Wt Readings from Last 1  Encounters:  10/14/22 177 lb (80.3 kg)   Last Height:   Ht Readings from Last 1 Encounters:  10/14/22 '5\' 2"'$  (1.575 m)    Physical exam: Exam: Gen: NAD, conversant, well nourised, well groomed                     CV: RRR, no MRG. No Carotid Bruits. No peripheral edema, warm, nontender Eyes: Conjunctivae clear without exudates or hemorrhage  Neuro: Detailed Neurologic Exam  Speech:    Speech is normal; fluent and spontaneous with normal comprehension.  Cognition:    The patient is oriented to person, place, and time;     recent and remote memory intact;     language fluent;     normal attention, concentration,     fund of knowledge Cranial Nerves:    The pupils are equal, round, and reactive to light. The fundi are normal and spontaneous venous pulsations are present. Visual fields are full to finger confrontation. Extraocular movements are intact. Trigeminal sensation is intact and the muscles of mastication are normal. The face is symmetric. The palate elevates in the midline. Hearing intact. Voice is normal. Shoulder shrug is normal. The tongue has normal motion without fasciculations.   Coordination:    Normal finger to nose and heel to shin. Normal rapid alternating movements.   Gait:    Heel-toe and tandem gait are normal.   Motor Observation:    No asymmetry, no atrophy, and no involuntary movements noted. No tremor. Tone:    Normal muscle tone.     Posture:    Posture is normal. normal erect    Strength:    Strength is V/V in the upper and lower limbs.      Sensation: intact to LT. Romberg negative     Reflex Exam:  DTR's:    Absent AJs otherwise deep tendon reflexes in the upper and lower extremities are symmetrical bilaterally.  Trace patellars and biceps Toes:    The toes are downgoing bilaterally.   Clonus:    Clonus is absent.     Assessment/Plan:  Here for years of dizziness, extensive workup in the past by [place] and cardiology, cardiology Dxed with  vasovagal syncope, mri brain in the past unremarkable left mastoid effusion in 2021 by Dr. Posey Pronto, . In the past scheduled a DAT scan for tremor evaluation doesn't appear to ever have been completed. We have seen her as far back as 2016 for dizziness with a complete workup and no neurologic etiology. Back for the same. 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 Neuro exam normal, good balance, non focal, not symptomatic, not orthostatic.   Tremor is stable, never worsened, never progressed so unlikely PD do not need DAT Scan   Still having dizzy spells, here with her husband, Dxed with Vasovagal by cardiology, always when standing or walking, ongoing for years over 10 years, MRI in 2021 did not show strokes, pre-syncope, worse if getting up too fast (not wearing compression stockings today), starts lightheaded, felt like she was going to black out, sitting resolves and it goes away, no loss of consciousness, or altered mental status, or shaking or focal weakness, sit down for a while and they everything is back to normal. No other focal neurologic deficits, associated symptoms, inciting events or modifiable factors.  Not orthostatic today  Will check CTA H&N otherwise we have nothing more for patient here in neurology return to pcp   Supine 123/56 66 standing 112/60 69 (symptomatic when getting up), 3 minutes 121/70 68  Orders Placed This Encounter  Procedures   CT ANGIO HEAD W OR WO CONTRAST   CT ANGIO NECK W OR WO CONTRAST   Basic Metabolic Panel      Cc: Ernst Spell, NP,    Sarina Ill, MD  [facility] [address] [place], [place] [address]  Phone [phone] Fax [phone]

## 2022-10-14 NOTE — Patient Instructions (Addendum)
CT angiogram of the head and neck.   CT Angiogram A CT angiogram is a painless procedure to look at the blood vessels in various areas of the body. For this procedure, a large X-ray machine, called a CT scanner, takes detailed pictures of blood vessels that have been injected with a contrast dye. A CT angiogram allows your health care provider to see how well blood is flowing to the area of your body that is being checked. Your health care provider will be able to see if there are any problems, such as a blockage. Tell a health care provider about: Any allergies you have. This is especially important if you have had a previous allergic reaction to contrast dye or iodine. All medicines you are taking, including vitamins, herbs, eye drops, creams, and over-the-counter medicines. Any bleeding problems you have. Any medical conditions you have. Any surgeries you have had. Whether you are pregnant or may be pregnant. Whether you are breastfeeding. Any anxiety disorders, long-term (chronic) pain, or other conditions you have that may increase your stress or prevent you from lying still. What are the risks? Generally, this is a safe procedure. However, problems may occur, including: Infection. Bleeding or pain at the IV insertion site. Allergic reactions to medicines or dyes. Damage to other structures or organs. This may include kidney damage from the contrast dye that is used. Increased risk of cancer from radiation exposure. This risk is low. Talk with your health care provider about: The risks and benefits of testing. How you can receive the lowest dose of radiation. What happens before the procedure? Wear comfortable clothing and remove any jewelry, glasses, dentures, and hearing aids. Follow instructions from your health care provider about eating and drinking. These may include not eating or drinking for a few hours before the procedure. The contrast dye can cause nausea, but this is less  likely if your stomach is empty. Ask your health care provider about changing or stopping your regular medicines. This is especially important if you are taking diabetes medicines or blood thinners. What happens during the procedure?  An IV will be inserted into one of your veins. You will be asked to lie on an exam table. This table will slide in and out of the CT machine during the procedure. Contrast dye will be injected into the IV. You might feel: Warm or flushed. A metallic taste in your mouth. That you have to urinate. The person running the machine will give you instructions while the scans are being done. You may be asked to: Keep your arms above your head. Hold your breath. Stay very still, even if the table is moving. When the scanning is complete, you will be moved out of the machine. The IV will be removed. The procedure may vary among health care providers and hospitals. What can I expect after the procedure? For a few minutes after the procedure, it is common to have: A metallic taste in your mouth from the contrast dye. A feeling of warmth. It is up to you to get the results of your procedure. Ask your health care provider, or the department that is doing the procedure, when your results will be ready. Follow these instructions at home: Activity If you were given a sedative during the procedure, it can affect you for several hours. Do not drive or operate machinery until your health care provider says that it is safe. Return to your normal activities as told by your health care provider. Ask your  health care provider what activities are safe for you. General instructions Check your IV insertion area every day for signs of infection. Check for: Redness, swelling, or pain. Fluid or blood. Warmth. Pus or a bad smell. Take over-the-counter and prescription medicines only as told by your health care provider. If told, drink enough fluid to keep your urine pale yellow. This  will help to flush the contrast dye out of your body. Keep all follow-up visits. This is important. Contact a health care provider if: You have any symptoms of allergy to the contrast dye. These include: Shortness of breath. Rash or itchy, red, swollen areas of skin (hives). A racing heartbeat. You have redness, swelling, warmth, pus, or pain around the IV insertion site. Summary A CT angiogram is a procedure to look at the blood vessels in various areas of the body. Tell your health care provider about any allergies you have. This is especially important if you have had a previous allergic reaction to contrast dye or iodine. The IV contrast dye can make you feel warm, flushed, or the need to urinate. You may also have a metallic taste in your mouth. These feelings may last for a few minutes. After the procedure, if told, drink enough fluid to keep your urine pale yellow. This will help to flush the contrast dye out of your body. Contact a health care provider if you have any symptoms of allergy to the contrast dye. This information is not intended to replace advice given to you by your health care provider. Make sure you discuss any questions you have with your health care provider. Document Revised: 05/21/2021 Document Reviewed: 01/21/2021 Elsevier Patient Education  [place].

## 2022-10-15 ENCOUNTER — Telehealth: Payer: Self-pay | Admitting: *Deleted

## 2022-10-15 LAB — BASIC METABOLIC PANEL
BUN/Creatinine Ratio: 21 (ref 12–28)
BUN: 28 mg/dL — ABNORMAL HIGH (ref 8–27)
CO2: 23 mmol/L (ref 20–29)
Calcium: 9.6 mg/dL (ref 8.7–10.3)
Chloride: 105 mmol/L (ref 96–106)
Creatinine, Ser: 1.36 mg/dL — ABNORMAL HIGH (ref 0.57–1.00)
Glucose: 71 mg/dL (ref 70–99)
Potassium: 5.3 mmol/L — ABNORMAL HIGH (ref 3.5–5.2)
Sodium: 142 mmol/L (ref 134–144)
eGFR: 40 mL/min/{1.73_m2} — ABNORMAL LOW (ref >59)

## 2022-10-15 NOTE — Telephone Encounter (Signed)
-----  Message from Melvenia Beam, MD sent at 10/15/2022  2:08 PM EST ----- Bmp stable, creatinine 1.36 she can get contrast for her CTA. F/u with pcp who is managing. thanks

## 2022-10-15 NOTE — Telephone Encounter (Signed)
Called pt & relayed lab results per Dr Jaynee Eagles, ok to get contrast w/ CTA. F/u with PCP who is managing. Pt was appreciative. She will f/u here if she hasn't hear about CTA within 1-2 weeks.

## 2022-10-20 ENCOUNTER — Telehealth: Payer: Self-pay | Admitting: Neurology

## 2022-10-20 NOTE — Telephone Encounter (Signed)
Dillon Bjork: [id number] exp. 10/20/22-11/18/22 sent to [facility] [phone]

## 2022-10-29 NOTE — Telephone Encounter (Signed)
Her appt at [facility] is 3/7 will need a new auth

## 2022-11-06 DIAGNOSIS — M25562 Pain in left knee: Secondary | ICD-10-CM | POA: Diagnosis not present

## 2022-11-06 DIAGNOSIS — M25561 Pain in right knee: Secondary | ICD-10-CM | POA: Diagnosis not present

## 2022-11-06 DIAGNOSIS — Z6832 Body mass index (BMI) 32.0-32.9, adult: Secondary | ICD-10-CM | POA: Diagnosis not present

## 2022-11-06 DIAGNOSIS — M170 Bilateral primary osteoarthritis of knee: Secondary | ICD-10-CM | POA: Diagnosis not present

## 2022-11-06 DIAGNOSIS — I10 Essential (primary) hypertension: Secondary | ICD-10-CM | POA: Diagnosis not present

## 2022-11-06 DIAGNOSIS — Z0130 Encounter for examination of blood pressure without abnormal findings: Secondary | ICD-10-CM | POA: Diagnosis not present

## 2022-11-06 DIAGNOSIS — Z79899 Other long term (current) drug therapy: Secondary | ICD-10-CM | POA: Diagnosis not present

## 2022-11-19 NOTE — Telephone Encounter (Signed)
They don't do extensions, I had to wait until the order expired to do a new one. It expired yesterday 2/28.  New [employer] medicare Josem KaufmannJosem Kaufmann: [id number] exp. 2/29/024-12/18/22 for both scans for [facility].

## 2022-11-19 NOTE — Telephone Encounter (Signed)
Pt has called to report that the initial approval for her CT scan was from Jan to Feb.  Her scan has been scheduled for 11-23-22.Pt states she was told by Herbert Spires that she would fix this.  Pt has been told by her insurance company([employer]) that nothing has been submitted re: an approval extension request at least until 11-26-22.  Pt states the only # she has that may help Tori to reach [place] which handles this type request with Lorella Nimrod is the [phone].  Pt asking to be called on this as well.  If not available on home # pt asking to be called on her husband's cell of [phone]

## 2022-11-26 ENCOUNTER — Ambulatory Visit
Admission: RE | Admit: 2022-11-26 | Discharge: 2022-11-26 | Disposition: A | Discharge status: Home / Self Care | Payer: Medicare Other | Source: Ambulatory Visit | Attending: Neurology | Admitting: Neurology

## 2022-11-26 ENCOUNTER — Other Ambulatory Visit: Payer: Self-pay | Admitting: Neurology

## 2022-11-26 DIAGNOSIS — R55 Syncope and collapse: Secondary | ICD-10-CM | POA: Diagnosis not present

## 2022-11-26 DIAGNOSIS — R42 Dizziness and giddiness: Secondary | ICD-10-CM

## 2022-11-26 MED ORDER — IOPAMIDOL (ISOVUE-370) INJECTION 76%
60.0000 mL | Freq: Once | INTRAVENOUS | Status: AC | PRN
  Administered 2022-11-26: 60 mL via INTRAVENOUS

## 2022-12-01 ENCOUNTER — Telehealth: Payer: Self-pay | Admitting: *Deleted

## 2022-12-01 NOTE — Telephone Encounter (Signed)
-----   Message from Melvenia Beam, MD sent at 11/30/2022  6:04 PM EDT ----- The blood vessels of the head and neck and CT of he brain looked fine. No significant atherosclerosis or stenosis or flow-limiting issue. Incidentally found is a right-sided thyroid nodule measuring up to 1.5 cm.  Katie: Recommend further evaluation with dedicated thyroid ultrasound if not already completed (cc PCP)

## 2022-12-01 NOTE — Telephone Encounter (Signed)
Spoke to patient gave test results . Gave Dr Cathren Laine  recommendation for  right-sided thyroid nodule measuring up to 1.5 cm. Pt will call PCP today to discuss further testing Pt expressed understanding and thanked me for calling

## 2022-12-04 DIAGNOSIS — M170 Bilateral primary osteoarthritis of knee: Secondary | ICD-10-CM | POA: Diagnosis not present

## 2022-12-04 DIAGNOSIS — Z6832 Body mass index (BMI) 32.0-32.9, adult: Secondary | ICD-10-CM | POA: Diagnosis not present

## 2022-12-04 DIAGNOSIS — I10 Essential (primary) hypertension: Secondary | ICD-10-CM | POA: Diagnosis not present

## 2022-12-04 DIAGNOSIS — Z0130 Encounter for examination of blood pressure without abnormal findings: Secondary | ICD-10-CM | POA: Diagnosis not present

## 2022-12-04 DIAGNOSIS — J302 Other seasonal allergic rhinitis: Secondary | ICD-10-CM | POA: Diagnosis not present

## 2022-12-04 DIAGNOSIS — Z79899 Other long term (current) drug therapy: Secondary | ICD-10-CM | POA: Diagnosis not present

## 2022-12-04 DIAGNOSIS — M47819 Spondylosis without myelopathy or radiculopathy, site unspecified: Secondary | ICD-10-CM | POA: Diagnosis not present

## 2022-12-20 DIAGNOSIS — M1990 Unspecified osteoarthritis, unspecified site: Secondary | ICD-10-CM | POA: Diagnosis not present

## 2022-12-20 DIAGNOSIS — M797 Fibromyalgia: Secondary | ICD-10-CM | POA: Diagnosis not present

## 2022-12-20 DIAGNOSIS — M0609 Rheumatoid arthritis without rheumatoid factor, multiple sites: Secondary | ICD-10-CM | POA: Diagnosis not present

## 2022-12-20 DIAGNOSIS — M810 Age-related osteoporosis without current pathological fracture: Secondary | ICD-10-CM | POA: Diagnosis not present

## 2023-01-01 DIAGNOSIS — M170 Bilateral primary osteoarthritis of knee: Secondary | ICD-10-CM | POA: Diagnosis not present

## 2023-01-01 DIAGNOSIS — Z6832 Body mass index (BMI) 32.0-32.9, adult: Secondary | ICD-10-CM | POA: Diagnosis not present

## 2023-01-01 DIAGNOSIS — J069 Acute upper respiratory infection, unspecified: Secondary | ICD-10-CM | POA: Diagnosis not present

## 2023-01-01 DIAGNOSIS — I10 Essential (primary) hypertension: Secondary | ICD-10-CM | POA: Diagnosis not present

## 2023-01-01 DIAGNOSIS — M47819 Spondylosis without myelopathy or radiculopathy, site unspecified: Secondary | ICD-10-CM | POA: Diagnosis not present

## 2023-01-01 DIAGNOSIS — Z0130 Encounter for examination of blood pressure without abnormal findings: Secondary | ICD-10-CM | POA: Diagnosis not present

## 2023-01-01 DIAGNOSIS — Z79899 Other long term (current) drug therapy: Secondary | ICD-10-CM | POA: Diagnosis not present

## 2023-01-12 DIAGNOSIS — M79643 Pain in unspecified hand: Secondary | ICD-10-CM | POA: Diagnosis not present

## 2023-01-12 DIAGNOSIS — M1990 Unspecified osteoarthritis, unspecified site: Secondary | ICD-10-CM | POA: Diagnosis not present

## 2023-01-12 DIAGNOSIS — Z79899 Other long term (current) drug therapy: Secondary | ICD-10-CM | POA: Diagnosis not present

## 2023-01-12 DIAGNOSIS — M0609 Rheumatoid arthritis without rheumatoid factor, multiple sites: Secondary | ICD-10-CM | POA: Diagnosis not present

## 2023-01-26 ENCOUNTER — Ambulatory Visit: Payer: Medicare Other | Admitting: Internal Medicine

## 2023-01-26 ENCOUNTER — Ambulatory Visit: Payer: Medicare Other

## 2023-01-26 ENCOUNTER — Encounter: Payer: Self-pay | Admitting: Internal Medicine

## 2023-01-26 VITALS — BP 145/79 | HR 51 | Ht 62.0 in | Wt 177.6 lb

## 2023-01-26 DIAGNOSIS — R55 Syncope and collapse: Secondary | ICD-10-CM

## 2023-01-26 DIAGNOSIS — I10 Essential (primary) hypertension: Secondary | ICD-10-CM

## 2023-01-26 DIAGNOSIS — R42 Dizziness and giddiness: Secondary | ICD-10-CM | POA: Diagnosis not present

## 2023-01-26 NOTE — Progress Notes (Unsigned)
Primary Physician:  Linus Galas, NP   Patient ID: Cynthia Farrell, female    DOB: 1945/11/18, 77 y.o.   MRN: [id number]  Subjective:    Chief Complaint  Patient presents with   Dizziness   Follow-up    HPI: Cynthia Farrell  is a 77 y.o. female  Caucasian female  with mild hypertension, hyperdynamic LV with intra ventricular pressure gradient by echocardiogram previously, palpitations which have resolved since being on diltiazem, improvement in dyspnea since reducing her heart rate with diltiazem, obesity, history of rheumatoid arthritis, osteo arthritis, fibromyalgia.  [name]  Past Medical History:  Diagnosis Date   Atrial fibrillation (HCC)    Breast cancer (HCC)    breast    Diverticular disease    Dysrhythmia    a remote history in 1990's - per Dr. Verl Dicker notes.   Esophageal stricture    Fibromyalgia    GERD (gastroesophageal reflux disease)    Heart murmur    Hemorrhoids    Hyperlipemia    Hypertension    Kidney disease    MVP (mitral valve prolapse)    Osteoarthritis    RA   Pneumonia    Rheumatoid aortitis    Stage 3 chronic kidney disease (HCC)     Past Surgical History:  Procedure Laterality Date   ABDOMINAL HYSTERECTOMY  1995   APPENDECTOMY  1956   BREAST BIOPSY  1972   BREAST LUMPECTOMY Left    CARDIAC CATHETERIZATION  1997 & 2005   CATARACT EXTRACTION Bilateral    CHOLECYSTECTOMY  1999   COLONOSCOPY  02/12/2012   Procedure: COLONOSCOPY;  Surgeon: Louis Meckel, MD;  Location: [facility];  Service: Endoscopy;  Laterality: N/A;   DILATION AND CURETTAGE OF UTERUS     ESOPHAGEAL DILATION     ESOPHAGOGASTRODUODENOSCOPY  02/12/2012   Procedure: ESOPHAGOGASTRODUODENOSCOPY (EGD);  Surgeon: Louis Meckel, MD;  Location: [facility];  Service: Endoscopy;  Laterality: N/A;   HOT HEMOSTASIS  02/12/2012   Procedure: HOT HEMOSTASIS (ARGON PLASMA COAGULATION/BICAP);  Surgeon: Louis Meckel, MD;  Location: [facility];   Service: Endoscopy;  Laterality: N/A;   LUMBAR LAMINECTOMY/DECOMPRESSION MICRODISCECTOMY Left 03/01/2020   Procedure: Left Lumbar One-Two Microdiscectomy;  Surgeon: Maeola Harman, MD;  Location: [facility] OR;  Service: Neurosurgery;  Laterality: Left;  Left Lumbar One-Two Microdiscectomy   MASTECTOMY Right 1986   with implant   THYROID CYST EXCISION  2000   TONSILLECTOMY  1968   Social History   Tobacco Use   Smoking status: Never   Smokeless tobacco: Never  Substance Use Topics   Alcohol use: No  Marital Status: Married   Review of Systems  Cardiovascular:  Positive for near-syncope. Negative for chest pain, dyspnea on exertion and leg swelling.  Musculoskeletal:  Positive for arthritis and back pain.  Gastrointestinal:  Negative for melena.  Neurological:  Positive for dizziness, light-headedness and loss of balance.   Objective:      01/26/2023    1:33 PM 10/14/2022    2:52 PM 07/28/2022    2:22 PM  Vitals with BMI  Height 5\' 2"  5\' 2"  5\' 2"   Weight 177 lbs 10 oz 177 lbs 175 lbs 10 oz  BMI 32.48 32.37 32.11  Systolic 161  119  Diastolic 78  78  Pulse 56  67    Orthostatic VS for the past 72 hrs (Last 3 readings):  Patient Position BP Location Cuff Size  01/26/23 1333 Sitting Left Arm Large  Physical Exam Vitals reviewed.  Constitutional:      Appearance: She is obese.     Comments: Mildly obese  Neck:     Vascular: Carotid bruit (Bilateral) present. No JVD.  Cardiovascular:     Rate and Rhythm: Normal rate and regular rhythm.     Pulses: Normal pulses and intact distal pulses.          Dorsalis pedis pulses are 2+ on the right side and 2+ on the left side.       Posterior tibial pulses are 2+ on the right side and 2+ on the left side.     Heart sounds: Murmur heard.     Crescendo early systolic murmur is present with a grade of 2/6 at the upper right sternal border and apex.  Pulmonary:     Effort: Pulmonary effort is normal. No accessory muscle usage or  respiratory distress.     Breath sounds: Normal breath sounds.  Abdominal:     General: Bowel sounds are normal.     Palpations: Abdomen is soft.  Musculoskeletal:        General: No swelling. Normal range of motion.  Neurological:     Mental Status: She is oriented to person, place, and time.    Radiology: No results found.  Laboratory examination:    PRN Meds:. Medications Discontinued During This Encounter  Medication Reason   carisoprodol (SOMA) 250 MG tablet Completed Course   calcium carbonate (OS-CAL - DOSED IN MG OF ELEMENTAL CALCIUM) 1250 (500 Ca) MG tablet Completed Course   Cholecalciferol (D3 ADULT PO) Change in therapy    Current Outpatient Medications  Medication Instructions   alendronate (FOSAMAX) 70 mg, Oral, Weekly   amoxicillin (AMOXIL) 2,000 mg, Oral, See admin instructions, Take 4 capsules prior to having teeth cleaned or dental procedure    Biotin 1,000 mcg, Oral, Daily   diltiazem (CARDIZEM LA) 120 mg, Oral, Daily   ezetimibe (ZETIA) 10 mg, Oral, Daily   Farxiga 10 mg, Oral, Daily   fluticasone (FLONASE) 50 MCG/ACT nasal spray SMARTSIG:1-2 Spray(s) Both Nares Every Night   fluvastatin (LESCOL) 20 mg, Oral, Daily   folic acid (FOLVITE) 800 mcg, Daily   HYDROcodone-acetaminophen (NORCO) 10-325 MG tablet 1 tablet, Oral, 3 times daily PRN, Most takes twice daily    leflunomide (ARAVA) 20 mg, Oral, Daily   losartan (COZAAR) 25 mg, Oral, Daily   Multiple Minerals-Vitamins (CALCIUM & VIT D3 BONE HEALTH PO) 1 capsule, Oral, Daily   omeprazole-sodium bicarbonate (ZEGERID) 40-1100 MG capsule 1 capsule, Oral, Daily   zolpidem (AMBIEN) 5 mg, Oral, Daily at bedtime    Cardiac Studies:   Cardiac cath 2002: Hyperdynamic LV and normal coronary arteries: Done for chest pain and exercise treadmill stress  exercise-induced nonsustained ventricular tachycardia.  Catherization 1994 and 1995 Normal  30 day event monitor 07/27/2018-08/25/2018: 2 patient triggered events  with reported symptoms related with normal sinus rhythm with first-degree block. Lowest bradycardiac episode occurred on day 19 (11/24) at 10:31 AM with HR at 44 bpm that patient stated she did have symptoms of near syncope during that time. No high degree AV block, A. fib, or SVT noted.   Echocardiogram 06/13/2018: Left ventricle cavity is normal in size. Normal global wall motion. Doppler evidence of grade I (impaired) diastolic dysfunction, normal LAP. Calculated EF 66%.  Left atrial cavity is mildly dilated at 4 cm and 52 mL. Structurally normal trileaflet aortic valve with no regurgitation noted. There is mild pressure gradient across the  AV with mean of 10 mm Hg and peak 18 mm Hg, AVA 1.99 cm. Compared to the study done on 03/23/2017, no significant change.   Carotid artery duplex 06/13/2018: No hemodynamically significant arterial disease in the internal carotid artery bilaterally. No significant plaque noted as well. Antegrade right vertebral artery flow. Antegrade left vertebral artery flow. Compared to the study done on 02/06/2015, no significant change. Incidental right thyroid nodule noted. Consider dedicated scan.   EKG 07/28/2022: Sinus bradycardia at rate of 59 bpm.  Normal axis.  Incomplete right bundle branch block.  No evidence of ischemia, injury pattern.  Compared to previous EKG on 04/01/2019, no significant change.  Assessment:     ICD-10-CM   1. Primary hypertension  I10     2. Dizziness  R42     3. Pre-syncope  R55        Recommendations:   Cynthia Farrell  is a 77 y.o. Caucasian female  with mild hypertension, hyperdynamic LV with intra ventricular pressure gradient by echocardiogram previously, palpitations which have resolved since being on diltiazem, improvement in dyspnea since reducing her heart rate with diltiazem, obesity, history of rheumatoid arthritis, osteo arthritis, fibromyalgia.  Dizziness Pre-syncope   Primary hypertension No clinical evidence of heart  failure, blood pressure is also well controlled. No change in physical exam including 3/6 midsystolic murmur at the apex. Continue current medications without changes.     Cynthia Dieter, DO 01/26/2023, 1:43 PM Office: [phone]

## 2023-01-28 DIAGNOSIS — D692 Other nonthrombocytopenic purpura: Secondary | ICD-10-CM | POA: Diagnosis not present

## 2023-01-28 DIAGNOSIS — L570 Actinic keratosis: Secondary | ICD-10-CM | POA: Diagnosis not present

## 2023-01-28 DIAGNOSIS — E7800 Pure hypercholesterolemia, unspecified: Secondary | ICD-10-CM | POA: Diagnosis not present

## 2023-01-28 DIAGNOSIS — R7303 Prediabetes: Secondary | ICD-10-CM | POA: Diagnosis not present

## 2023-01-28 DIAGNOSIS — E559 Vitamin D deficiency, unspecified: Secondary | ICD-10-CM | POA: Diagnosis not present

## 2023-01-28 DIAGNOSIS — L820 Inflamed seborrheic keratosis: Secondary | ICD-10-CM | POA: Diagnosis not present

## 2023-01-28 DIAGNOSIS — N1832 Chronic kidney disease, stage 3b: Secondary | ICD-10-CM | POA: Diagnosis not present

## 2023-01-28 DIAGNOSIS — L821 Other seborrheic keratosis: Secondary | ICD-10-CM | POA: Diagnosis not present

## 2023-01-28 DIAGNOSIS — D225 Melanocytic nevi of trunk: Secondary | ICD-10-CM | POA: Diagnosis not present

## 2023-01-28 DIAGNOSIS — I10 Essential (primary) hypertension: Secondary | ICD-10-CM | POA: Diagnosis not present

## 2023-01-28 DIAGNOSIS — D1801 Hemangioma of skin and subcutaneous tissue: Secondary | ICD-10-CM | POA: Diagnosis not present

## 2023-01-28 DIAGNOSIS — D485 Neoplasm of uncertain behavior of skin: Secondary | ICD-10-CM | POA: Diagnosis not present

## 2023-01-28 DIAGNOSIS — F419 Anxiety disorder, unspecified: Secondary | ICD-10-CM | POA: Diagnosis not present

## 2023-02-03 DIAGNOSIS — Z79899 Other long term (current) drug therapy: Secondary | ICD-10-CM | POA: Diagnosis not present

## 2023-02-03 DIAGNOSIS — N1832 Chronic kidney disease, stage 3b: Secondary | ICD-10-CM | POA: Diagnosis not present

## 2023-02-03 DIAGNOSIS — Z0130 Encounter for examination of blood pressure without abnormal findings: Secondary | ICD-10-CM | POA: Diagnosis not present

## 2023-02-03 DIAGNOSIS — M129 Arthropathy, unspecified: Secondary | ICD-10-CM | POA: Diagnosis not present

## 2023-02-03 DIAGNOSIS — E559 Vitamin D deficiency, unspecified: Secondary | ICD-10-CM | POA: Diagnosis not present

## 2023-02-03 DIAGNOSIS — M170 Bilateral primary osteoarthritis of knee: Secondary | ICD-10-CM | POA: Diagnosis not present

## 2023-02-03 DIAGNOSIS — Z6832 Body mass index (BMI) 32.0-32.9, adult: Secondary | ICD-10-CM | POA: Diagnosis not present

## 2023-02-03 DIAGNOSIS — I10 Essential (primary) hypertension: Secondary | ICD-10-CM | POA: Diagnosis not present

## 2023-02-03 DIAGNOSIS — M47819 Spondylosis without myelopathy or radiculopathy, site unspecified: Secondary | ICD-10-CM | POA: Diagnosis not present

## 2023-02-04 DIAGNOSIS — G8929 Other chronic pain: Secondary | ICD-10-CM | POA: Diagnosis not present

## 2023-02-04 DIAGNOSIS — M797 Fibromyalgia: Secondary | ICD-10-CM | POA: Diagnosis not present

## 2023-02-04 DIAGNOSIS — E7800 Pure hypercholesterolemia, unspecified: Secondary | ICD-10-CM | POA: Diagnosis not present

## 2023-02-04 DIAGNOSIS — I10 Essential (primary) hypertension: Secondary | ICD-10-CM | POA: Diagnosis not present

## 2023-02-19 DIAGNOSIS — M1990 Unspecified osteoarthritis, unspecified site: Secondary | ICD-10-CM | POA: Diagnosis not present

## 2023-02-19 DIAGNOSIS — M810 Age-related osteoporosis without current pathological fracture: Secondary | ICD-10-CM | POA: Diagnosis not present

## 2023-02-19 DIAGNOSIS — M0609 Rheumatoid arthritis without rheumatoid factor, multiple sites: Secondary | ICD-10-CM | POA: Diagnosis not present

## 2023-02-19 DIAGNOSIS — M797 Fibromyalgia: Secondary | ICD-10-CM | POA: Diagnosis not present

## 2023-03-02 DIAGNOSIS — Z6832 Body mass index (BMI) 32.0-32.9, adult: Secondary | ICD-10-CM | POA: Diagnosis not present

## 2023-03-02 DIAGNOSIS — Z79899 Other long term (current) drug therapy: Secondary | ICD-10-CM | POA: Diagnosis not present

## 2023-03-02 DIAGNOSIS — Z0130 Encounter for examination of blood pressure without abnormal findings: Secondary | ICD-10-CM | POA: Diagnosis not present

## 2023-03-02 DIAGNOSIS — I10 Essential (primary) hypertension: Secondary | ICD-10-CM | POA: Diagnosis not present

## 2023-03-02 DIAGNOSIS — N1832 Chronic kidney disease, stage 3b: Secondary | ICD-10-CM | POA: Diagnosis not present

## 2023-03-02 DIAGNOSIS — M170 Bilateral primary osteoarthritis of knee: Secondary | ICD-10-CM | POA: Diagnosis not present

## 2023-03-02 DIAGNOSIS — M47819 Spondylosis without myelopathy or radiculopathy, site unspecified: Secondary | ICD-10-CM | POA: Diagnosis not present

## 2023-03-11 DIAGNOSIS — M170 Bilateral primary osteoarthritis of knee: Secondary | ICD-10-CM | POA: Diagnosis not present

## 2023-03-31 DIAGNOSIS — M170 Bilateral primary osteoarthritis of knee: Secondary | ICD-10-CM | POA: Diagnosis not present

## 2023-03-31 DIAGNOSIS — N1832 Chronic kidney disease, stage 3b: Secondary | ICD-10-CM | POA: Diagnosis not present

## 2023-03-31 DIAGNOSIS — Z79899 Other long term (current) drug therapy: Secondary | ICD-10-CM | POA: Diagnosis not present

## 2023-03-31 DIAGNOSIS — M47819 Spondylosis without myelopathy or radiculopathy, site unspecified: Secondary | ICD-10-CM | POA: Diagnosis not present

## 2023-03-31 DIAGNOSIS — J302 Other seasonal allergic rhinitis: Secondary | ICD-10-CM | POA: Diagnosis not present

## 2023-03-31 DIAGNOSIS — R030 Elevated blood-pressure reading, without diagnosis of hypertension: Secondary | ICD-10-CM | POA: Diagnosis not present

## 2023-04-13 DIAGNOSIS — M25561 Pain in right knee: Secondary | ICD-10-CM | POA: Diagnosis not present

## 2023-04-13 DIAGNOSIS — Z79899 Other long term (current) drug therapy: Secondary | ICD-10-CM | POA: Diagnosis not present

## 2023-04-13 DIAGNOSIS — M79643 Pain in unspecified hand: Secondary | ICD-10-CM | POA: Diagnosis not present

## 2023-04-13 DIAGNOSIS — M1990 Unspecified osteoarthritis, unspecified site: Secondary | ICD-10-CM | POA: Diagnosis not present

## 2023-04-13 DIAGNOSIS — M25562 Pain in left knee: Secondary | ICD-10-CM | POA: Diagnosis not present

## 2023-04-13 DIAGNOSIS — M810 Age-related osteoporosis without current pathological fracture: Secondary | ICD-10-CM | POA: Diagnosis not present

## 2023-04-13 DIAGNOSIS — S92405A Nondisplaced unspecified fracture of left great toe, initial encounter for closed fracture: Secondary | ICD-10-CM | POA: Diagnosis not present

## 2023-04-13 DIAGNOSIS — M0609 Rheumatoid arthritis without rheumatoid factor, multiple sites: Secondary | ICD-10-CM | POA: Diagnosis not present

## 2023-05-03 DIAGNOSIS — Z79899 Other long term (current) drug therapy: Secondary | ICD-10-CM | POA: Diagnosis not present

## 2023-05-03 DIAGNOSIS — N1832 Chronic kidney disease, stage 3b: Secondary | ICD-10-CM | POA: Diagnosis not present

## 2023-05-03 DIAGNOSIS — E6609 Other obesity due to excess calories: Secondary | ICD-10-CM | POA: Diagnosis not present

## 2023-05-03 DIAGNOSIS — M47819 Spondylosis without myelopathy or radiculopathy, site unspecified: Secondary | ICD-10-CM | POA: Diagnosis not present

## 2023-05-03 DIAGNOSIS — I10 Essential (primary) hypertension: Secondary | ICD-10-CM | POA: Diagnosis not present

## 2023-05-03 DIAGNOSIS — M170 Bilateral primary osteoarthritis of knee: Secondary | ICD-10-CM | POA: Diagnosis not present

## 2023-05-18 DIAGNOSIS — M79675 Pain in left toe(s): Secondary | ICD-10-CM | POA: Diagnosis not present

## 2023-05-18 DIAGNOSIS — M170 Bilateral primary osteoarthritis of knee: Secondary | ICD-10-CM | POA: Diagnosis not present

## 2023-05-27 DIAGNOSIS — M170 Bilateral primary osteoarthritis of knee: Secondary | ICD-10-CM | POA: Diagnosis not present

## 2023-05-31 DIAGNOSIS — Z79899 Other long term (current) drug therapy: Secondary | ICD-10-CM | POA: Diagnosis not present

## 2023-05-31 DIAGNOSIS — N1832 Chronic kidney disease, stage 3b: Secondary | ICD-10-CM | POA: Diagnosis not present

## 2023-05-31 DIAGNOSIS — M47819 Spondylosis without myelopathy or radiculopathy, site unspecified: Secondary | ICD-10-CM | POA: Diagnosis not present

## 2023-05-31 DIAGNOSIS — M170 Bilateral primary osteoarthritis of knee: Secondary | ICD-10-CM | POA: Diagnosis not present

## 2023-05-31 DIAGNOSIS — I10 Essential (primary) hypertension: Secondary | ICD-10-CM | POA: Diagnosis not present

## 2023-06-03 DIAGNOSIS — M170 Bilateral primary osteoarthritis of knee: Secondary | ICD-10-CM | POA: Diagnosis not present

## 2023-06-28 DIAGNOSIS — E6609 Other obesity due to excess calories: Secondary | ICD-10-CM | POA: Diagnosis not present

## 2023-06-28 DIAGNOSIS — I10 Essential (primary) hypertension: Secondary | ICD-10-CM | POA: Diagnosis not present

## 2023-06-28 DIAGNOSIS — M47819 Spondylosis without myelopathy or radiculopathy, site unspecified: Secondary | ICD-10-CM | POA: Diagnosis not present

## 2023-06-28 DIAGNOSIS — Z79899 Other long term (current) drug therapy: Secondary | ICD-10-CM | POA: Diagnosis not present

## 2023-06-28 DIAGNOSIS — M170 Bilateral primary osteoarthritis of knee: Secondary | ICD-10-CM | POA: Diagnosis not present

## 2023-06-28 DIAGNOSIS — N1832 Chronic kidney disease, stage 3b: Secondary | ICD-10-CM | POA: Diagnosis not present

## 2023-07-14 DIAGNOSIS — Z23 Encounter for immunization: Secondary | ICD-10-CM | POA: Diagnosis not present

## 2023-07-14 DIAGNOSIS — M0609 Rheumatoid arthritis without rheumatoid factor, multiple sites: Secondary | ICD-10-CM | POA: Diagnosis not present

## 2023-07-14 DIAGNOSIS — Z79899 Other long term (current) drug therapy: Secondary | ICD-10-CM | POA: Diagnosis not present

## 2023-07-14 DIAGNOSIS — M1990 Unspecified osteoarthritis, unspecified site: Secondary | ICD-10-CM | POA: Diagnosis not present

## 2023-07-14 DIAGNOSIS — M79643 Pain in unspecified hand: Secondary | ICD-10-CM | POA: Diagnosis not present

## 2023-07-22 DIAGNOSIS — I10 Essential (primary) hypertension: Secondary | ICD-10-CM | POA: Diagnosis not present

## 2023-07-22 DIAGNOSIS — F419 Anxiety disorder, unspecified: Secondary | ICD-10-CM | POA: Diagnosis not present

## 2023-07-22 DIAGNOSIS — E7800 Pure hypercholesterolemia, unspecified: Secondary | ICD-10-CM | POA: Diagnosis not present

## 2023-07-22 DIAGNOSIS — E559 Vitamin D deficiency, unspecified: Secondary | ICD-10-CM | POA: Diagnosis not present

## 2023-07-22 DIAGNOSIS — R7303 Prediabetes: Secondary | ICD-10-CM | POA: Diagnosis not present

## 2023-07-22 DIAGNOSIS — N1832 Chronic kidney disease, stage 3b: Secondary | ICD-10-CM | POA: Diagnosis not present

## 2023-07-27 DIAGNOSIS — Z0130 Encounter for examination of blood pressure without abnormal findings: Secondary | ICD-10-CM | POA: Diagnosis not present

## 2023-07-27 DIAGNOSIS — R030 Elevated blood-pressure reading, without diagnosis of hypertension: Secondary | ICD-10-CM | POA: Diagnosis not present

## 2023-07-27 DIAGNOSIS — Z131 Encounter for screening for diabetes mellitus: Secondary | ICD-10-CM | POA: Diagnosis not present

## 2023-07-27 DIAGNOSIS — Z79899 Other long term (current) drug therapy: Secondary | ICD-10-CM | POA: Diagnosis not present

## 2023-07-27 DIAGNOSIS — M545 Low back pain, unspecified: Secondary | ICD-10-CM | POA: Diagnosis not present

## 2023-07-27 DIAGNOSIS — M129 Arthropathy, unspecified: Secondary | ICD-10-CM | POA: Diagnosis not present

## 2023-07-27 DIAGNOSIS — N183 Chronic kidney disease, stage 3 unspecified: Secondary | ICD-10-CM | POA: Diagnosis not present

## 2023-07-27 DIAGNOSIS — E6609 Other obesity due to excess calories: Secondary | ICD-10-CM | POA: Diagnosis not present

## 2023-07-29 ENCOUNTER — Ambulatory Visit: Payer: Medicare Other | Admitting: Cardiology

## 2023-08-04 DIAGNOSIS — N1831 Chronic kidney disease, stage 3a: Secondary | ICD-10-CM | POA: Diagnosis not present

## 2023-08-09 DIAGNOSIS — G8929 Other chronic pain: Secondary | ICD-10-CM | POA: Diagnosis not present

## 2023-08-09 DIAGNOSIS — M797 Fibromyalgia: Secondary | ICD-10-CM | POA: Diagnosis not present

## 2023-08-09 DIAGNOSIS — E7800 Pure hypercholesterolemia, unspecified: Secondary | ICD-10-CM | POA: Diagnosis not present

## 2023-08-09 DIAGNOSIS — I10 Essential (primary) hypertension: Secondary | ICD-10-CM | POA: Diagnosis not present

## 2023-08-09 DIAGNOSIS — Z0000 Encounter for general adult medical examination without abnormal findings: Secondary | ICD-10-CM | POA: Diagnosis not present

## 2023-08-24 DIAGNOSIS — G8929 Other chronic pain: Secondary | ICD-10-CM | POA: Diagnosis not present

## 2023-08-24 DIAGNOSIS — M170 Bilateral primary osteoarthritis of knee: Secondary | ICD-10-CM | POA: Diagnosis not present

## 2023-08-24 DIAGNOSIS — M545 Low back pain, unspecified: Secondary | ICD-10-CM | POA: Diagnosis not present

## 2023-08-24 DIAGNOSIS — M549 Dorsalgia, unspecified: Secondary | ICD-10-CM | POA: Diagnosis not present

## 2023-08-24 DIAGNOSIS — Z79899 Other long term (current) drug therapy: Secondary | ICD-10-CM | POA: Diagnosis not present

## 2023-08-24 DIAGNOSIS — I10 Essential (primary) hypertension: Secondary | ICD-10-CM | POA: Diagnosis not present

## 2023-11-08 ENCOUNTER — Telehealth: Payer: Self-pay | Admitting: Cardiology

## 2023-11-08 ENCOUNTER — Other Ambulatory Visit: Payer: Self-pay

## 2023-11-08 DIAGNOSIS — Z1231 Encounter for screening mammogram for malignant neoplasm of breast: Secondary | ICD-10-CM

## 2023-11-08 MED ORDER — DILTIAZEM HCL ER 120 MG PO TB24: 120.0000 mg | ORAL_TABLET | Freq: Every day | ORAL | 1 refills | Status: AC

## 2023-11-08 NOTE — Telephone Encounter (Signed)
*  STAT* If patient is at the pharmacy, call can be transferred to refill team.   1. Which medications need to be refilled? (please list name of each medication and dose if known) new prescription for Diltiazem- she says she does not need this until March  2. Would you like to learn more about the convenience, safety, & potential cost savings by using the [facility]?     3. Are you open to using the [facility] (Type [facility].    4. Which pharmacy/location (including street and city if local pharmacy) is medication to be sent to?[facility] [place], [place]   5. Do they need a 30 day or 90 day supply? 90 days and refills

## 2023-11-19 ENCOUNTER — Ambulatory Visit
Admission: RE | Admit: 2023-11-19 | Discharge: 2023-11-19 | Disposition: A | Discharge status: Home / Self Care | Payer: Medicare Other | Source: Ambulatory Visit

## 2023-11-19 DIAGNOSIS — Z1231 Encounter for screening mammogram for malignant neoplasm of breast: Secondary | ICD-10-CM

## 2024-01-26 ENCOUNTER — Ambulatory Visit: Payer: Medicare Other | Attending: Cardiology | Admitting: Cardiology

## 2024-01-26 ENCOUNTER — Ambulatory Visit: Payer: Medicare Other | Admitting: Cardiology

## 2024-01-26 ENCOUNTER — Encounter: Payer: Self-pay | Admitting: Cardiology

## 2024-01-26 VITALS — BP 100/72 | HR 57 | Resp 16 | Ht 62.0 in | Wt 179.0 lb

## 2024-01-26 DIAGNOSIS — R011 Cardiac murmur, unspecified: Secondary | ICD-10-CM

## 2024-01-26 DIAGNOSIS — R55 Syncope and collapse: Secondary | ICD-10-CM | POA: Diagnosis not present

## 2024-01-26 DIAGNOSIS — R002 Palpitations: Secondary | ICD-10-CM | POA: Diagnosis not present

## 2024-01-26 DIAGNOSIS — I10 Essential (primary) hypertension: Secondary | ICD-10-CM | POA: Diagnosis not present

## 2024-01-26 NOTE — Patient Instructions (Signed)
 Medication Instructions:  Your physician has recommended you make the following change in your medication:   1) DECREASE losartan  to 12.5 mg daily  *If you need a refill on your cardiac medications before your next appointment, please call your pharmacy*  Testing/Procedures: Your physician has requested that you have an echocardiogram. Echocardiography is a painless test that uses sound waves to create images of your heart. It provides your doctor with information about the size and shape of your heart and how well your heart's chambers and valves are working. This procedure takes approximately one hour. There are no restrictions for this procedure. Please do NOT wear cologne, perfume, aftershave, or lotions (deodorant is allowed). Please arrive 15 minutes prior to your appointment time.  Please note: We ask at that you not bring children with you during ultrasound (echo/ vascular) testing. Due to room size and safety concerns, children are not allowed in the ultrasound rooms during exams. Our front office staff cannot provide observation of children in our lobby area while testing is being conducted. An adult accompanying a patient to their appointment will only be allowed in the ultrasound room at the discretion of the ultrasound technician under special circumstances. We apologize for any inconvenience.  Follow-Up: At [facility], you and your health needs are our priority.  As part of our continuing mission to provide you with exceptional heart care, our providers are all part of one team.  This team includes your primary Cardiologist (physician) and Advanced Practice Providers or APPs (Physician Assistants and Nurse Practitioners) who all work together to provide you with the care you need, when you need it.  Your next appointment:   As needed  The format for your next appointment:   In Person  Provider:   Knox Perl, MD{  We recommend signing up for the patient portal called  "MyChart".  Sign up information is provided on this After Visit Summary.  MyChart is used to connect with patients for Virtual Visits (Telemedicine).  Patients are able to view lab/test results, encounter notes, upcoming appointments, etc.  Non-urgent messages can be sent to your provider as well.   To learn more about what you can do with MyChart, go to [email or web address].

## 2024-01-26 NOTE — Progress Notes (Signed)
 EKG 01/26/2024: Normal sinus rhythm at rate of 57 bpm, incomplete right bundle branch block, normal EKG. Cardiology Office Note:  .   Date:  01/26/2024  ID:  Cynthia Farrell, DOB November 14, 1945, MRN [id number] PCP: Yvonnie Heritage, NP  [facility] Providers Cardiologist:  Knox Perl, MD   History of Present Illness: Cynthia Farrell   Cynthia Farrell is a 78 y.o. Caucasian female with mild hypertension, hyperdynamic LV with intra ventricular pressure gradient by echocardiogram previously, palpitations which have resolved since being on diltiazem , improvement in dyspnea since reducing her heart rate with diltiazem , obesity, history of rheumatoid arthritis, osteo arthritis, fibromyalgia.  Her last echocardiogram in 2019 had revealed grade 1 diastolic dysfunction and mild LVOT gradient of 18 mmHg.  Discussed the use of AI scribe software for clinical note transcription with the patient, who gave verbal consent to proceed.  History of Present Illness Cynthia Farrell is a 78 year old female with stage 3 kidney disease and hypertension who presents with near syncope and elevated potassium levels. She experiences episodes of near syncope, characterized by a sudden sensation of impending loss of consciousness. These episodes have been occurring for several years, with the last one in February. She has learned to sit down during these episodes and has not experienced full syncope. No recent episodes have occurred since February.  Her potassium levels are elevated, with the most recent level at 5.3 mmol/L. She has stage 3 kidney disease and is taking Farxiga. Her serum creatinine is 1.4 mg/dL, indicating stable kidney function.  She is on losartan  25 mg once daily for hypertension, which may contribute to elevated potassium levels. She also takes diltiazem  120 mg once daily for palpitations and heart rate management. No recent palpitations or heart-related concerns are noted.  Labs    Lab Results  Component Value Date   NA 142  10/14/2022   K 5.3 (H) 10/14/2022   CO2 23 10/14/2022   GLUCOSE 71 10/14/2022   BUN 28 (H) 10/14/2022   CREATININE 1.36 (H) 10/14/2022   CALCIUM 9.6 10/14/2022   EGFR 40 (L) 10/14/2022   GFRNONAA 44 (L) 02/26/2020      Latest Ref Rng & Units 10/14/2022    3:34 PM 02/26/2020    1:58 PM 02/10/2020    4:08 PM  BMP  Glucose 70 - 99 mg/dL 71  478  295   BUN 8 - 27 mg/dL 28  31  26    Creatinine 0.57 - 1.00 mg/dL 6.21  3.08  6.57   BUN/Creat Ratio 12 - 28 21     Sodium 134 - 144 mmol/L 142  139  141   Potassium 3.5 - 5.2 mmol/L 5.3  5.2  4.6   Chloride 96 - 106 mmol/L 105  102  104   CO2 20 - 29 mmol/L 23  27  29    Calcium 8.7 - 10.3 mg/dL 9.6  8.9  8.8       Latest Ref Rng & Units 02/26/2020    1:58 PM 02/10/2020    4:08 PM 04/10/2012    2:35 AM  CBC  WBC 4.0 - 10.5 K/uL 12.0  9.1  10.0   Hemoglobin 12.0 - 15.0 g/dL 84.6  96.2  95.2   Hematocrit 36.0 - 46.0 % 44.7  39.3  38.9   Platelets 150 - 400 K/uL 273  319  273    No results found for: "HGBA1C"  Lab Results  Component Value Date   TSH 2.370 11/15/2019  External Labs:  Care Everywhere labs 01/24/2024:  Serum glucose 85  , BUN 31, creatinine 1.4, EGFR 39 mL.  Potassium 5.3.  Hb 14.3/HCT 44.5, platelets 180.  Vitamin D38.8.  PTH markedly elevated at 147.  ROS  Review of Systems  Cardiovascular:  Positive for near-syncope and palpitations. Negative for chest pain, dyspnea on exertion and leg swelling.    Physical Exam:   VS:  BP 100/72 (BP Location: Left Arm, Patient Position: Sitting, Cuff Size: Large)   Pulse (!) 57   Resp 16   Ht 5\' 2"  (1.575 m)   Wt 179 lb (81.2 kg)   SpO2 94%   BMI 32.74 kg/m    Wt Readings from Last 3 Encounters:  01/26/24 179 lb (81.2 kg)  01/26/23 177 lb 9.6 oz (80.6 kg)  10/14/22 177 lb (80.3 kg)    Physical Exam Neck:     Vascular: Carotid bruit (bilateral conducted sounds from systolic murmur) present. No JVD.  Cardiovascular:     Rate and Rhythm: Normal rate and regular  rhythm.     Pulses: Intact distal pulses.     Heart sounds: S1 normal and S2 normal. Murmur heard.     Early systolic murmur is present with a grade of 2/6 at the upper right sternal border and apex.     No gallop.  Pulmonary:     Effort: Pulmonary effort is normal.     Breath sounds: Normal breath sounds.  Abdominal:     General: Bowel sounds are normal.     Palpations: Abdomen is soft.  Musculoskeletal:     Right lower leg: No edema.     Left lower leg: No edema.    Studies Reviewed: Cynthia Farrell     EKG:    EKG Interpretation Date/Time:  Wednesday Jan 26 2024 13:55:18 EDT Ventricular Rate:  57 PR Interval:  170 QRS Duration:  82 QT Interval:  402 QTC Calculation: 391 R Axis:   70  Text Interpretation: EKG 01/26/2024: Normal sinus rhythm at rate of 57 bpm, incomplete right bundle branch block, normal EKG. Confirmed by [name], [name] [phone]) on 01/26/2024 2:05:50 PM    EKG 07/28/2022: Sinus bradycardia at rate of 59 bpm.  Normal axis.  Incomplete right bundle branch block.  No evidence of ischemia, injury pattern.   Medications and allergies    No Known Allergies   Current Outpatient Medications:    alendronate (FOSAMAX) 70 MG tablet, Take 70 mg by mouth once a week., Disp: , Rfl:    amoxicillin (AMOXIL) 500 MG capsule, Take 2,000 mg by mouth See admin instructions. Take 4 capsules prior to having teeth cleaned or dental procedure, Disp: , Rfl:    Biotin  1000 MCG tablet, Take 1,000 mcg by mouth daily., Disp: , Rfl:    cyclobenzaprine (FLEXERIL) 10 MG tablet, Take 10 mg by mouth at bedtime., Disp: , Rfl:    denosumab (PROLIA) 60 MG/ML SOSY injection, Inject 60 mg into the skin every 6 (six) months., Disp: , Rfl:    diltiazem  (CARDIZEM  LA) 120 MG 24 hr tablet, Take 1 tablet (120 mg total) by mouth daily., Disp: 90 tablet, Rfl: 1   ezetimibe  (ZETIA ) 10 MG tablet, Take 10 mg by mouth daily., Disp: , Rfl:    famotidine (PEPCID) 40 MG tablet, Take 40 mg by mouth daily., Disp: , Rfl:     FARXIGA 10 MG TABS tablet, Take 10 mg by mouth daily., Disp: , Rfl:    fluticasone (FLONASE) 50 MCG/ACT nasal spray,  SMARTSIG:1-2 Spray(s) Both Nares Every Night, Disp: , Rfl:    fluvastatin (LESCOL) 20 MG capsule, Take 20 mg by mouth daily., Disp: , Rfl:    folic acid  (FOLVITE ) 800 MCG tablet, Take 800 mcg by mouth daily., Disp: , Rfl:    HYDROcodone -acetaminophen  (NORCO) 10-325 MG tablet, Take 1 tablet by mouth 3 (three) times daily as needed for moderate pain. Most takes twice daily, Disp: , Rfl:    leflunomide  (ARAVA ) 20 MG tablet, Take 20 mg by mouth daily., Disp: , Rfl:    losartan  (COZAAR ) 25 MG tablet, Take 12.5 mg by mouth daily., Disp: , Rfl:    Multiple Minerals-Vitamins (CALCIUM & VIT D3 BONE HEALTH PO), Take 1 capsule by mouth daily., Disp: , Rfl:    omeprazole-sodium bicarbonate (ZEGERID) 40-1100 MG capsule, Take 1 capsule by mouth daily. , Disp: , Rfl:    Oyster Shell Calcium 500 MG TABS, 1 tablet with a meal Orally Once a day, Disp: , Rfl:    zolpidem  (AMBIEN ) 10 MG tablet, Take 5 mg by mouth at bedtime. , Disp: , Rfl:    No orders of the defined types were placed in this encounter.    There are no discontinued medications.   ASSESSMENT AND PLAN: .      ICD-10-CM   1. Primary hypertension  I10 EKG 12-Lead    ECHOCARDIOGRAM COMPLETE    2. Palpitations  R00.2 EKG 12-Lead    3. Vasovagal syncope  R55 ECHOCARDIOGRAM COMPLETE    4. Systolic murmur  Z61.0 ECHOCARDIOGRAM COMPLETE      Assessment and Plan Assessment & Plan Hypertension   Hypertension is well-controlled with losartan  and diltiazem . Due to hyperkalemia concerns, reduce losartan  to 12.5 mg once daily while maintaining its kidney protection benefits. Blood pressure remains stable. Continue diltiazem  120 mg once daily.  Hyperkalemia   Mild hyperkalemia is present with potassium levels at 5.3. Losartan  contributes to elevated potassium but is necessary for kidney protection. Reduce losartan  to 12.5 mg once  daily. Avoid high potassium foods and fruit juices, especially orange juice. Monitor potassium levels.  Chronic Kidney Disease, Stage 3   Chronic kidney disease stage 3 is noted with creatinine clearance at 1.4. Losartan  is beneficial for kidney protection despite its effect on potassium levels. Continue monitoring kidney function and maintain losartan  for kidney protection.  Heart Murmur   A heart murmur is likely due to hyperdynamic heart function.  An echocardiogram is needed to further evaluate heart function.  Near Syncope   She experiences intermittent episodes of near syncope, often when standing or after exertion. Advised to sit or lay down during symptoms and perform maneuvers like clenching fists. No episodes of actual syncope reported. Report any episodes of syncope without warning.  She does have conducted bruit to the carotids bilaterally however she has had CT angiogram of the head and neck on 11/27/2022 showing no significant extracranial carotid artery stenosis.  From cardiac standpoint she remains stable, I will see her back on a as needed basis unless echocardiogram reveals any significant abnormality.   Signed,  Knox Perl, MD, [facility] 01/26/2024, 2:28 PM [facility] [address] [place], [place] [address] Phone: [phone]. Fax:  [phone]

## 2024-03-01 ENCOUNTER — Ambulatory Visit (HOSPITAL_COMMUNITY)
Admission: RE | Admit: 2024-03-01 | Discharge: 2024-03-01 | Disposition: A | Discharge status: Home / Self Care | Source: Ambulatory Visit | Attending: Cardiovascular Disease | Admitting: Cardiovascular Disease

## 2024-03-01 DIAGNOSIS — I10 Essential (primary) hypertension: Secondary | ICD-10-CM | POA: Diagnosis present

## 2024-03-01 DIAGNOSIS — R55 Syncope and collapse: Secondary | ICD-10-CM | POA: Diagnosis not present

## 2024-03-01 DIAGNOSIS — R011 Cardiac murmur, unspecified: Secondary | ICD-10-CM | POA: Diagnosis not present

## 2024-03-01 LAB — ECHOCARDIOGRAM COMPLETE
Area-P 1/2: 1.79 cm2
S' Lateral: 2.82 cm

## 2024-03-02 ENCOUNTER — Ambulatory Visit: Payer: Self-pay | Admitting: Cardiology

## 2024-03-02 NOTE — Progress Notes (Signed)
 Echocardiogram reviewed, normal heart function, mild LVH, no evidence of any obstruction in the ventricle and mild aortic valve thickening without stenosis.  Overall very stable echocardiogram.

## 2024-07-25 ENCOUNTER — Other Ambulatory Visit: Payer: Self-pay

## 2024-07-25 DIAGNOSIS — M5416 Radiculopathy, lumbar region: Secondary | ICD-10-CM

## 2024-08-12 ENCOUNTER — Ambulatory Visit
Admission: RE | Admit: 2024-08-12 | Discharge: 2024-08-12 | Disposition: A | Discharge status: Home / Self Care | Source: Ambulatory Visit

## 2024-08-12 DIAGNOSIS — M5416 Radiculopathy, lumbar region: Secondary | ICD-10-CM

## 2024-09-27 ENCOUNTER — Other Ambulatory Visit: Payer: Self-pay

## 2024-09-27 DIAGNOSIS — M533 Sacrococcygeal disorders, not elsewhere classified: Secondary | ICD-10-CM

## 2024-10-23 ENCOUNTER — Encounter: Payer: Self-pay | Admitting: Gastroenterology
